# Patient Record
Sex: Male | Born: 1950 | Race: White | Hispanic: No
Health system: Western US, Academic
[De-identification: ages and names within clinical notes are randomized; demographics above are authoritative.]

---

## 2003-06-05 ENCOUNTER — Ambulatory Visit (EMERGENCY_DEPARTMENT_HOSPITAL): Payer: Self-pay

## 2003-06-06 ENCOUNTER — Encounter (HOSPITAL_COMMUNITY): Payer: Self-pay | Admitting: Surgery of the Hand

## 2003-06-22 ENCOUNTER — Encounter (HOSPITAL_BASED_OUTPATIENT_CLINIC_OR_DEPARTMENT_OTHER): Payer: Self-pay | Admitting: Rehabilitative and Restorative Service Providers"

## 2003-06-22 ENCOUNTER — Encounter (HOSPITAL_BASED_OUTPATIENT_CLINIC_OR_DEPARTMENT_OTHER): Payer: Self-pay

## 2003-07-27 ENCOUNTER — Encounter (HOSPITAL_BASED_OUTPATIENT_CLINIC_OR_DEPARTMENT_OTHER): Payer: Self-pay | Admitting: Rehabilitative and Restorative Service Providers"

## 2003-07-27 ENCOUNTER — Encounter (HOSPITAL_BASED_OUTPATIENT_CLINIC_OR_DEPARTMENT_OTHER): Payer: Self-pay

## 2003-08-24 ENCOUNTER — Encounter (HOSPITAL_BASED_OUTPATIENT_CLINIC_OR_DEPARTMENT_OTHER): Payer: Self-pay

## 2003-09-21 ENCOUNTER — Encounter (HOSPITAL_BASED_OUTPATIENT_CLINIC_OR_DEPARTMENT_OTHER): Payer: Medicaid Other

## 2011-04-05 ENCOUNTER — Emergency Department (HOSPITAL_BASED_OUTPATIENT_CLINIC_OR_DEPARTMENT_OTHER)
Admission: EM | Admit: 2011-04-05 | Discharge: 2011-04-05 | Disposition: A | Discharge status: Home / Self Care | Payer: Self-pay | Attending: Emergency Medicine | Admitting: Emergency Medicine

## 2011-04-05 ENCOUNTER — Other Ambulatory Visit (EMERGENCY_DEPARTMENT_HOSPITAL): Payer: Self-pay | Admitting: Emergency Medicine

## 2011-04-05 DIAGNOSIS — R1031 Right lower quadrant pain: Secondary | ICD-10-CM | POA: Insufficient documentation

## 2011-04-05 DIAGNOSIS — R109 Unspecified abdominal pain: Secondary | ICD-10-CM | POA: Insufficient documentation

## 2011-04-05 LAB — CBC, DIFF
% Basophils: 0 % (ref 0–1)
% Eosinophils: 3 % (ref 0–7)
% Immature Granulocytes: 0 % (ref 0–1)
% Lymphocytes: 51 % (ref 19–53)
% Monocytes: 8 % (ref 5–13)
% Neutrophils: 38 % (ref 34–71)
Absolute Eosinophil Count: 0.24 10*3/uL (ref 0.00–0.50)
Absolute Lymphocyte Count: 3.66 10*3/uL (ref 1.00–4.80)
Basophils: 0.03 10*3/uL (ref 0.00–0.20)
Hematocrit: 38 % (ref 38–50)
Hemoglobin: 13.1 g/dL (ref 13.0–18.0)
Immature Granulocytes: 0.01 10*3/uL (ref 0.00–0.05)
MCH: 31 pg (ref 27.3–33.6)
MCHC: 34.4 g/dL (ref 32.2–36.5)
MCV: 90 fL (ref 81–98)
Monocytes: 0.56 10*3/uL (ref 0.00–0.80)
Neutrophils: 2.75 10*3/uL (ref 1.80–7.00)
Platelet Count: 270 10*3/uL (ref 150–400)
RBC: 4.22 mil/uL — ABNORMAL LOW (ref 4.40–5.60)
RDW-CV: 13.8 % (ref 11.6–14.4)
WBC: 7.25 10*3/uL (ref 4.30–10.00)

## 2011-04-05 LAB — HEPATIC FUNCTION PANEL
ALT (GPT): 28 U/L (ref 10–48)
AST (GOT): 22 U/L (ref 15–40)
Albumin: 3.7 g/dL (ref 3.5–5.2)
Alkaline Phosphatase (Total): 71 U/L (ref 37–159)
Bilirubin (Direct): 0.1 mg/dL (ref 0.0–0.3)
Bilirubin (Total): 0.7 mg/dL (ref 0.2–1.3)
Protein (Total): 7.3 g/dL (ref 6.0–8.2)

## 2011-04-05 LAB — 1ST EXTRA LIME GREEN TOP

## 2011-04-05 LAB — PROTHROMBIN & PTT
Partial Thromboplastin Time: 29 s (ref 22–35)
Prothrombin INR: 1 (ref 0.8–1.3)
Prothrombin Time Patient: 12.5 s (ref 10.7–15.6)

## 2011-04-05 LAB — BASIC METABOLIC PANEL
Anion Gap: 8 (ref 3–11)
Calcium: 9.5 mg/dL (ref 8.9–10.2)
Carbon Dioxide, Total: 27 mEq/L (ref 22–32)
Chloride: 103 mEq/L (ref 98–108)
Creatinine: 0.94 mg/dL (ref 0.51–1.18)
GFR, Calc, African American: >60 mL/min (ref >59)
GFR, Calc, European American: >60 mL/min (ref >59)
Glucose: 104 mg/dL (ref 62–125)
Potassium: 4.4 mEq/L (ref 3.7–5.2)
Sodium: 138 mEq/L (ref 136–145)
Urea Nitrogen: 18 mg/dL (ref 8–21)

## 2013-02-19 ENCOUNTER — Other Ambulatory Visit (HOSPITAL_COMMUNITY): Payer: Self-pay | Admitting: Cardiovascular Disease

## 2013-02-19 ENCOUNTER — Other Ambulatory Visit (HOSPITAL_COMMUNITY): Payer: Self-pay

## 2013-02-19 ENCOUNTER — Inpatient Hospital Stay (HOSPITAL_COMMUNITY): Payer: Self-pay | Admitting: Cardiovascular Disease

## 2013-02-19 ENCOUNTER — Other Ambulatory Visit (EMERGENCY_DEPARTMENT_HOSPITAL): Payer: Self-pay | Admitting: Physician Assistant

## 2013-02-19 ENCOUNTER — Ambulatory Visit (HOSPITAL_BASED_OUTPATIENT_CLINIC_OR_DEPARTMENT_OTHER): Payer: Self-pay

## 2013-02-19 ENCOUNTER — Inpatient Hospital Stay (HOSPITAL_BASED_OUTPATIENT_CLINIC_OR_DEPARTMENT_OTHER)
Admission: EM | Admit: 2013-02-19 | Discharge: 2013-02-21 | DRG: 853 | Disposition: A | Discharge status: Home / Self Care | Payer: Self-pay | Attending: Cardiovascular Disease | Admitting: Cardiovascular Disease

## 2013-02-19 DIAGNOSIS — I214 Non-ST elevation (NSTEMI) myocardial infarction: Principal | ICD-10-CM | POA: Diagnosis present

## 2013-02-19 DIAGNOSIS — F17200 Nicotine dependence, unspecified, uncomplicated: Secondary | ICD-10-CM | POA: Diagnosis present

## 2013-02-19 DIAGNOSIS — I2510 Atherosclerotic heart disease of native coronary artery without angina pectoris: Secondary | ICD-10-CM | POA: Diagnosis present

## 2013-02-19 DIAGNOSIS — I9589 Other hypotension: Secondary | ICD-10-CM | POA: Diagnosis not present

## 2013-02-19 LAB — PROTHROMBIN & PTT
Partial Thromboplastin Time: 27 s (ref 22–35)
Prothrombin INR: 0.9 (ref 0.8–1.3)
Prothrombin Time Patient: 12.5 s (ref 10.7–15.6)

## 2013-02-19 LAB — CBC, DIFF
% Basophils: 0 %
% Basophils: 0 %
% Eosinophils: 2 %
% Eosinophils: 1 %
% Immature Granulocytes: 0 %
% Immature Granulocytes: 0 %
% Lymphocytes: 31 %
% Lymphocytes: 33 %
% Monocytes: 6 %
% Monocytes: 7 %
% Neutrophils: 61 %
% Neutrophils: 59 %
Absolute Eosinophil Count: 0.31 10*3/uL (ref 0.00–0.50)
Absolute Eosinophil Count: 0.12 10*3/uL (ref 0.00–0.50)
Absolute Lymphocyte Count: 4.35 10*3/uL (ref 1.00–4.80)
Absolute Lymphocyte Count: 2.87 10*3/uL (ref 1.00–4.80)
Basophils: 0.03 10*3/uL (ref 0.00–0.20)
Basophils: 0.02 10*3/uL (ref 0.00–0.20)
Hematocrit: 42 % (ref 38–50)
Hematocrit: 38 % (ref 38–50)
Hemoglobin: 14.4 g/dL (ref 13.0–18.0)
Hemoglobin: 13 g/dL (ref 13.0–18.0)
Immature Granulocytes: 0.05 10*3/uL (ref 0.00–0.05)
Immature Granulocytes: 0.01 10*3/uL (ref 0.00–0.05)
MCH: 32.2 pg (ref 27.3–33.6)
MCH: 31.6 pg (ref 27.3–33.6)
MCHC: 34.7 g/dL (ref 32.2–36.5)
MCHC: 34.5 g/dL (ref 32.2–36.5)
MCV: 93 fL (ref 81–98)
MCV: 92 fL (ref 81–98)
Monocytes: 0.79 10*3/uL (ref 0.00–0.80)
Monocytes: 0.58 10*3/uL (ref 0.00–0.80)
Neutrophils: 8.44 10*3/uL — ABNORMAL HIGH (ref 1.80–7.00)
Neutrophils: 5.19 10*3/uL (ref 1.80–7.00)
Platelet Count: 274 10*3/uL (ref 150–400)
Platelet Count: 239 10*3/uL (ref 150–400)
RBC: 4.47 mil/uL (ref 4.40–5.60)
RBC: 4.11 mil/uL — ABNORMAL LOW (ref 4.40–5.60)
RDW-CV: 14.2 % (ref 11.6–14.4)
RDW-CV: 14.1 % (ref 11.6–14.4)
WBC: 13.97 10*3/uL — ABNORMAL HIGH (ref 4.30–10.00)
WBC: 8.79 10*3/uL (ref 4.30–10.00)

## 2013-02-19 LAB — CHEST PAIN REFLEXIVE TESTING
Troponin_I Interpretation: ELEVATED
Troponin_I: 0.09 ng/mL — ABNORMAL HIGH (ref <0.04)
Troponin_I: 30.21 ng/mL — ABNORMAL HIGH (ref <0.04)

## 2013-02-19 LAB — CK MB MASS & QUOTIENT
CK MB Mass: 143 ng/mL — ABNORMAL HIGH (ref 0–5)
CK MB Quotient: 11 — ABNORMAL HIGH (ref 0–3)

## 2013-02-19 LAB — BASIC METABOLIC PANEL
Anion Gap: 6 (ref 3–11)
Calcium: 8.8 mg/dL — ABNORMAL LOW (ref 8.9–10.2)
Carbon Dioxide, Total: 21 mEq/L — ABNORMAL LOW (ref 22–32)
Chloride: 107 mEq/L (ref 98–108)
Creatinine: 0.8 mg/dL (ref 0.51–1.18)
GFR, Calc, African American: >60 mL/min (ref >59)
GFR, Calc, European American: >60 mL/min (ref >59)
Glucose: 97 mg/dL (ref 62–125)
Potassium: 4.3 mEq/L (ref 3.7–5.2)
Sodium: 134 mEq/L — ABNORMAL LOW (ref 136–145)
Urea Nitrogen: 18 mg/dL (ref 8–21)

## 2013-02-19 LAB — COMPREHENSIVE METABOLIC PANEL
ALT (GPT): 24 U/L (ref 10–48)
AST (GOT): 32 U/L (ref 15–40)
Albumin: 4.2 g/dL (ref 3.5–5.2)
Alkaline Phosphatase (Total): 84 U/L (ref 37–159)
Anion Gap: 5 (ref 3–11)
Bilirubin (Total): 0.4 mg/dL (ref 0.2–1.3)
Calcium: 9.3 mg/dL (ref 8.9–10.2)
Carbon Dioxide, Total: 26 mEq/L (ref 22–32)
Chloride: 103 mEq/L (ref 98–108)
Creatinine: 1.01 mg/dL (ref 0.51–1.18)
GFR, Calc, African American: >60 mL/min (ref >59)
GFR, Calc, European American: >60 mL/min (ref >59)
Glucose: 177 mg/dL — ABNORMAL HIGH (ref 62–125)
Potassium: 4.3 mEq/L (ref 3.7–5.2)
Protein (Total): 7 g/dL (ref 6.0–8.2)
Sodium: 134 mEq/L — ABNORMAL LOW (ref 136–145)
Urea Nitrogen: 21 mg/dL (ref 8–21)

## 2013-02-19 LAB — LAB ADD ON ORDER

## 2013-02-19 LAB — CK, CREATINE KINASE, TOTAL ACTIVITY: Creatine Kinase Total Activity: 1249 U/L — ABNORMAL HIGH (ref 30–285)

## 2013-02-19 LAB — L LACTATE, VENOUS WB (TO U/H LAB WITHIN 30 MIN): L Lactate (Direct), Venous Whole Blood: 1.8 mmol/L (ref 0.6–2.5)

## 2013-02-19 LAB — MAGNESIUM: Magnesium: 2 mg/dL (ref 1.8–2.4)

## 2013-02-20 ENCOUNTER — Other Ambulatory Visit (HOSPITAL_COMMUNITY): Payer: Self-pay | Admitting: Cardiovascular Disease

## 2013-02-20 LAB — BASIC METABOLIC PANEL
Anion Gap: 4 (ref 3–11)
Calcium: 8.3 mg/dL — ABNORMAL LOW (ref 8.9–10.2)
Carbon Dioxide, Total: 20 mEq/L — ABNORMAL LOW (ref 22–32)
Chloride: 110 mEq/L — ABNORMAL HIGH (ref 98–108)
Creatinine: 0.8 mg/dL (ref 0.51–1.18)
GFR, Calc, African American: >60 mL/min (ref >59)
GFR, Calc, European American: >60 mL/min (ref >59)
Glucose: 103 mg/dL (ref 62–125)
Potassium: 3.7 mEq/L (ref 3.7–5.2)
Sodium: 134 mEq/L — ABNORMAL LOW (ref 136–145)
Urea Nitrogen: 17 mg/dL (ref 8–21)

## 2013-02-20 LAB — CK MB MASS & QUOTIENT
CK MB Mass: 87 ng/mL — ABNORMAL HIGH (ref 0–5)
CK MB Quotient: 10 — ABNORMAL HIGH (ref 0–3)

## 2013-02-20 LAB — CK, CREATINE KINASE, TOTAL ACTIVITY: Creatine Kinase Total Activity: 899 U/L — ABNORMAL HIGH (ref 30–285)

## 2013-02-20 LAB — CHEST PAIN REFLEXIVE TESTING: Troponin_I: 18.52 ng/mL — ABNORMAL HIGH (ref <0.04)

## 2013-02-20 LAB — LIPID PANEL
Cholesterol (LDL): 115 mg/dL (ref <130)
Cholesterol/HDL Ratio: 5.4
HDL Cholesterol: 31 mg/dL — ABNORMAL LOW (ref >40)
Non-HDL Cholesterol: 136 mg/dL (ref 0–159)
Total Cholesterol: 167 mg/dL (ref <200)
Triglyceride: 105 mg/dL (ref <150)

## 2013-02-20 LAB — HEMOGLOBIN A1C, HPLC: Hemoglobin A1C: 5.5 % (ref 4.0–6.0)

## 2013-02-21 LAB — R/O MRSA

## 2013-02-21 LAB — R/O ACINETOBACTER CULTURE

## 2013-02-21 LAB — VANCO-RESIST ENTEROCOCCUS C/S

## 2013-02-25 ENCOUNTER — Ambulatory Visit: Payer: Self-pay

## 2013-02-25 NOTE — Telephone Encounter (Signed)
Pts daughter calling, states pt is having pain in the site of his cardiac catheter, denies swelling but states the pain is getting worse and travelling along his whole leg. Pt has follow up appt on Friday October 3rd, 14, and also has cardiology follow up on October 6, 14. Pt reports he is able to bear weight, has no other complaints other than the pain at the angio site, and describes swelling there the size of a bean. Advised pt to use ice take tyelnol and if those measures are not effective to go on in to ED, as he has no PCP, and is waiting to be seen at after care clinic and cardiology clinic later this week.

## 2013-02-25 NOTE — Telephone Encounter (Signed)
Protocol: POST-OP SYMPTOMS AND QUESTIONS-ADULT-AH  Affirmative: Other post-op symptom or question (all triage questions negative)  Disposition of Home Care suggested.

## 2013-02-28 ENCOUNTER — Ambulatory Visit (HOSPITAL_BASED_OUTPATIENT_CLINIC_OR_DEPARTMENT_OTHER): Payer: Medicaid Other | Attending: Internal Medicine | Admitting: Internal Medicine

## 2013-02-28 ENCOUNTER — Encounter (HOSPITAL_BASED_OUTPATIENT_CLINIC_OR_DEPARTMENT_OTHER): Payer: Self-pay

## 2013-02-28 VITALS — BP 112/79 | HR 80 | Temp 97.9°F | Resp 80 | Ht 66.0 in | Wt 160.0 lb

## 2013-02-28 DIAGNOSIS — R209 Unspecified disturbances of skin sensation: Secondary | ICD-10-CM | POA: Insufficient documentation

## 2013-02-28 DIAGNOSIS — I2510 Atherosclerotic heart disease of native coronary artery without angina pectoris: Secondary | ICD-10-CM | POA: Insufficient documentation

## 2013-02-28 MED ORDER — NITROGLYCERIN 0.3 MG SL SUBL
0.3000 mg | SUBLINGUAL_TABLET | SUBLINGUAL | Status: DC | PRN
Start: 2013-02-28 — End: 2013-02-28

## 2013-02-28 MED ORDER — NITROGLYCERIN 0.4 MG SL SUBL
0.4000 mg | SUBLINGUAL_TABLET | SUBLINGUAL | Status: DC | PRN
Start: 2013-02-28 — End: 2014-01-04

## 2013-02-28 NOTE — Patient Instructions (Addendum)
Take aspirin and clopidogrel (Plavix) EVERY DAY. This is critically important.     Okay to stop atorvastatin and lisinopril for now.     Continue to avoid ANY smoking.     Try a nitroglycerin tablet if you have any chest pain.     Don't go back to work until you see the heart doctor on Monday.     Please bring all of your medications to your next appointment.    If you have questions or concerns about your health, call [phone].

## 2013-02-28 NOTE — Progress Notes (Signed)
AFTER CARE CLINIC NOTE    ID/CHIEF COMPLAINT: The patient is a 62 year old year-old man here for inpatient follow-up of STEMI. The interview and exam were completed with the assistance of an in-person [place] interpreter. The patient's daughter was also present.     PROBLEM LIST:  Patient Active Problem List    Diagnosis Date Noted   . Coronary artery disease 02/28/2013     -- STEMI with DES to RCA (02/21/2013)   -- Echo with LVEF 50%, no LV thrombus      . Right leg paresthesias 02/28/2013        CURRENT MEDICATIONS:  as of end of visit on 02/28/2013  Current Outpatient Prescriptions   Medication Sig   . Aspirin 81 MG Oral Tab EC 81 mg by Mouth Daily   . Clopidogrel Bisulfate 75 MG Oral Tab 75 mg by Mouth Daily   . Nitroglycerin 0.4 MG Sublingual SL Tab Place 1 tablet (0.4 mg) under the tongue every 5 minutes as needed for chest pain. Repeat as needed up to 3 doses. If no relief, call 911.     No current facility-administered medications for this visit.       ALLERGIES:  Review of patient's allergies indicates:  No Known Allergies    INTERVAL HISTORY:  The patient was recently admitted to the cardiology service from 02/21/13 to 02/23/13 for STEMI. He presented with severe substernal chest pain and was found to have an inferior STEMI. In the cardiac cath lab his RCA was completely occluded and a drug-eluting stent was placed at that time. He was started on clopidogrel, aspirin, ACE-inhibitor and statin, but beta-blocker was held due to bradycardia and borderline hypotension (SBPs in the 90s).     Today the patient reports stopping all medications since Monday, except he continued taking aspirin. This was because he was having difficulty breathing and headache, symptoms that he attributed to medication side effects. These symptoms resolved after discontinuing the medications.     He does continue to have episodes of chest pressure, similar in character but much less severe than when he had the heart attack. These  episodes last about 5-10 minutes and occur mostly with exertion. No dyspnea, orthopnea or leg swelling. Not currently experiencing any chest pain.     He also reports tingling, burning pain in his right leg, which has been a problem since his appendectomy 2-3 years ago. This is worse in certain positions and has been worse since Sunday. No leg weakness and no change in his chronic low back pain.     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 Denies alcohol consumption or illicit drug use. He emigrated from [place] in 1999. He works as a Geophysical data processor.     REVIEW OF SYSTEMS:  Negative except as per HPI.     PHYSICAL EXAM:  VITALS - BP 112/79  Pulse 80  Temp(Src) 97.9 F (36.6 C) (Temporal)  Resp 80  Ht 5\' 6"  (1.676 m)  Wt 160 lb (72.576 kg)  BMI 25.84 kg/m2  GENERAL - no acute distress   HEENT - anicteric, moist mucous membranes   RESPIRATORY - breathing comfortably, speaking in full sentences, lungs clear throughout, no crackles or rhonchi   CARDIOVASCULAR - regular, no murmurs, strong radial pulses, no leg edema, JVP  5 cm H2O   ABDOMEN - normoactive bowel sounds, soft, non-tender, non-distended, no organomegaly   GU - right inguinal area with small nodule in femoral area, no femoral bruits or thrills, nearly resolved ecchymoses   NEURO - alert and appropriate, speech fluent, moving all extremities, gait smooth and narrow   MENTAL STATUS - pleasant and cooperative, normal affect     STUDIES:  No new results.     Cardiac catheterization report reviewed and notable for complete RCA obstruction with subsequent drug-eluting stent placement. No other significant coronary lesions identified.     Echocardiogram with expected wall motion abnormalities and slightly low ejection fraction, but no significant valvular  pathology.     ASSESSMENT/PLAN:    Coronary artery disease s/p recent STEMI: Overall the patient is improved, but I am concerned about his residual angina, particularly as he self-discontinued the clopidogrel. Discussed with Dr. Corrie Dandy, who agrees that actual in-stent thrombosis is unlikely given his overall clinical improvement. I spent a considerable amount of time reinforcing the critical importance of taking aspirin and clopidogrel daily. Given his concerns about medication side effects, I advised that he could stop the atorvastatin and lisinopril for the time being, so long as he continues aspirin and clopidogrel daily without fail. He should ultimately be taking a statin (perhaps at reduced dose) as well as ACE-inhibitor and beta-blocker. Will defer re-introduction of these medications to his cardiologist (appointment with Dr. Olevia Perches on Monday 10/6) and PCP. In the interim I provided a supply of sublingual nitroglycerin for him to take with any anginal symptoms.     Chronic leg pain & paresthesias: Not discussed in depth today, but there may have been some nerve irritation from catheterization in the femoral artery. No evidence of pseudoaneurysm on exam, but could consider vascular duplex if symptoms worsen or persist.       FOLLOW-UP & PRIMARY CARE REFERRAL:  The patient is already an established patient of ARNP Lynden Ang at the [facility] in [place], [place]. He will return there for primary care follow-up.

## 2013-03-03 ENCOUNTER — Telehealth (HOSPITAL_BASED_OUTPATIENT_CLINIC_OR_DEPARTMENT_OTHER): Payer: Self-pay | Admitting: Cardiovascular Disease

## 2013-03-03 ENCOUNTER — Encounter (HOSPITAL_BASED_OUTPATIENT_CLINIC_OR_DEPARTMENT_OTHER): Payer: Self-pay | Admitting: Cardiovascular Disease

## 2013-03-03 ENCOUNTER — Ambulatory Visit (HOSPITAL_BASED_OUTPATIENT_CLINIC_OR_DEPARTMENT_OTHER): Payer: Medicaid Other | Attending: Cardiovascular Disease | Admitting: Cardiovascular Disease

## 2013-03-03 VITALS — BP 109/77 | HR 63 | Temp 96.8°F | Resp 18 | Ht 66.0 in | Wt 162.7 lb

## 2013-03-03 DIAGNOSIS — I2510 Atherosclerotic heart disease of native coronary artery without angina pectoris: Secondary | ICD-10-CM | POA: Insufficient documentation

## 2013-03-03 DIAGNOSIS — G5790 Unspecified mononeuropathy of unspecified lower limb: Secondary | ICD-10-CM | POA: Insufficient documentation

## 2013-03-03 DIAGNOSIS — I219 Acute myocardial infarction, unspecified: Secondary | ICD-10-CM | POA: Insufficient documentation

## 2013-03-03 DIAGNOSIS — I209 Angina pectoris, unspecified: Secondary | ICD-10-CM | POA: Insufficient documentation

## 2013-03-03 MED ORDER — METOPROLOL TARTRATE 25 MG OR TABS
12.5000 mg | ORAL_TABLET | Freq: Every day | ORAL | Status: AC
Start: 2013-03-03 — End: ?

## 2013-03-03 MED ORDER — CAPSAICIN 0.025 % EX CREA
1.0000 | TOPICAL_CREAM | Freq: Three times a day (TID) | CUTANEOUS | Status: AC
Start: 2013-03-03 — End: ?

## 2013-03-03 NOTE — Telephone Encounter (Signed)
Pt needs appt w/ Olevia Perches in 3 mos.

## 2013-03-03 NOTE — Patient Instructions (Signed)
1. Continue the aspirin and plavix    2. Cut the ATORVASTATIN in half, take 40mg  daily. If you continue to have muscle pains with this, call the clinic and we will write you a different medication.    3. Schedule an Echo (Heart Ultrasound) for approximately 10/25.    4. Start the new medication called Metoprolol (beta blocker).    5. You can stop the lisinopril.    6. Follow-up with Dr. Olevia Perches in 3 months.

## 2013-03-03 NOTE — Progress Notes (Addendum)
Cardiology Clinic Note    Chief Complaint:  [name] is a 62 year old year old [place] speaking male with no known past medical history here for hospital follow-up after STEMI.    History of Present Illness:   Patient is seen with his Daughter who provides interpretation for today's visit. Leonard Price is a 62yo gentleman with previously no medical history who was admitted to the [facility] cardiology service from 9/24-9/26 after he presented with 10/10 substernal chest pain. He was found to have ST elevations in leads II, III, and aVF and code STEMI was called and he was sent to the cath lab where 100% RCA occlusion was discovered and a drug eluting stent was placed. There was no significant left sided coronary disease. He was stared on aspirin, clopidogrel, and atorvastatin 80mg  daily. Beta blocker was not started due to asymptomatic bradycardia to the 50s while he was admitted.    Last week, the patient presented to aftercare clinic for follow-up where it was discovered that he had stopped his medications due to concerns of side effects. He began to have diffuse muscle pains and dyspnea which he attributed to his new medications. He did not know which was the cause so he stopped them all. After discussion with Dr. Graciela Husbands in aftercare clinic, he resumed his aspirin and plavix and has been taking these since without fail. He has not yet resumed his atorvastatin as he was instructed to discuss this with [place] today.    He states that he now has stable, exertional chest pressure which improves with rest. He also reports some mild dyspnea on exertion. This is new since his hospitalization. He denies that this is getting any worse. He denies any palpitations, orthopnea, PND or edema. He has taken his SL NTG on one occasion which resulted in improvement of his chest pain. His only other complaint is a burning pain on his right medial thigh that is new since his catheterization. He denies any bruising, swelling, or rash in the  area.      Patient Active Problem List   Diagnosis   . Coronary artery disease   . Right leg paresthesias       Outpatient Prescriptions Prior to Visit   Medication Sig Dispense Refill   . Aspirin 81 MG Oral Tab EC 81 mg by Mouth Daily       . Clopidogrel Bisulfate 75 MG Oral Tab 75 mg by Mouth Daily       . Nitroglycerin 0.4 MG Sublingual SL Tab Place 1 tablet (0.4 mg) under the tongue every 5 minutes as needed for chest pain. Repeat as needed up to 3 doses. If no relief, call 911.  25 tablet  0     No facility-administered medications prior to visit.       ROS:  Pt denies SOB, CP, palpitations, dizziness at the present encounter. Otherwise, ROS is as per HPI, all other systems are negative.    Physical Examination:   BP 109/77  Pulse 63  Temp(Src) 96.8 F (36 C) (Temporal)  Resp 18  Ht 5\' 6"  (1.676 m)  Wt 162 lb 11.2 oz (73.8 kg)  BMI 26.27 kg/m2  SpO2 100%  Gen: Thin, well appearing and in NAD   HEENT: sclera anicteric   Neck: supple, no JVD, no bruit   Pulm: ctab; no wheezing/rhonchi   CV: rrr; no m/r/g   Abdomen: soft; nt/nd; bs+   Skin: no rash   Extr: no edema, right femoral cath site  without bruising, swelling, pulsations, or bruits.   Neuro: alert and oriented x3    Studies:  Cardiac Cath 02/19/13:  Dominance: [x]  Right [_] Left [_] Codominant  Left main: Medium caliber and angiographically normal.   Left anterior descending: Medium sized vessel that is angiographically normal. It gives off a medium sized first diagonal branch which is angiographically normal.   Circumflex: Medium sized vessel proximally but small in size distally as it enters the AV groove. The first obtuse marginal is a large vessel that has luminal irregularities and feeds a large portion of the inferolateral wall.   Right coronary artery: Occluded in the proximal vessel, just past the ostium.    Left Heart Hemodynamics:   Left ventricular pressure: 120/10 mmHg  Aortic pressure: 120/77/106 mmHg    IMPRESSIONS:  [x]  1 Vessel  coronary artery disease.  [x]  Aortic stenosis is not present.   [x]  Left ventricular end diastolic pressures is [x]  normal [_] abnormal  [x]  Systemic aortic pressure is [x]  normal [_] abnormal       Transthoracic Echo 02/20/13:  Conclusions:  1. The rhythm is sinus  2. LV size is normal. LV systolic function is low-normal.  a. LV EF is 49.8% by 3D biplane analysis  b. Wall motion abnormalities are present, as described in the above diagram  c. Diastolic function is normal with normal left atrial filling pressure  c. No LV thrombus is seen  3. Trace aortic regurgitation. Trace mitral regurgitation. Trace tricuspid regurgitation. Mild pulmonary regurgitation.  4. Right ventricular size and function are normal.   5. No pericardial effusion is present  6. The aortic root diameter at the sinuses of valsalva is at the upper limit of normal  7. No prior echo for comparison     EKG:  02/19/13 14:12:  EKG showed ST elevations in leads II, III, aVF w/ reciprocal changes in I, aVL, V2, V3 concerning for possible inferior MI. Sinus rhythm, rate 69.    02/19/13 14:25: Right sided EKG showed ST elevations in rV4 (3/4 box), rV5, rV6. Sinus rhythm, rate 71.    Assessment/Plan:  [name] is a 62yo man here for follow-up of RCA STEMI. He is doing well status post PCI with occasional, mild exertional angina.    # CAD s/p PCI with DES to RCA on 02/20/13  Doing will with some mild anginal symptoms. 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 Will also start BB today. May need long acting nitrate in the future if he continues to have angina with this regimen.  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 Will restart at lower dose as above. Consider switching to alternative high intensity statin if he has muscle pains with this.    # RLE neuropathy  Likely secondary to his catheterization. No aneurysm present. Resassured him that this will improve. Can try capsaicin cream for symptomatic relief.    Patient seen and discussed with Dr. Silvestre Gunner, Attending Physician.    Roderic Palau MD  R2, Internal Medicine  p: [phone]    -------------------------------------------  Attending: Cloyd Stagers, MD  I saw and  evaluated the patient and agree with Dr. Deanne Coffer note.

## 2013-03-04 NOTE — Addendum Note (Signed)
Addended by: Silvestre Gunner DAVID on: 03/04/2013 08:55 AM     Modules accepted: Level of Service

## 2013-03-21 ENCOUNTER — Ambulatory Visit (HOSPITAL_BASED_OUTPATIENT_CLINIC_OR_DEPARTMENT_OTHER): Payer: Medicaid Other | Attending: Cardiovascular Disease

## 2013-03-21 DIAGNOSIS — I252 Old myocardial infarction: Secondary | ICD-10-CM | POA: Insufficient documentation

## 2013-03-21 DIAGNOSIS — I7090 Unspecified atherosclerosis: Secondary | ICD-10-CM | POA: Insufficient documentation

## 2013-06-02 ENCOUNTER — Encounter (HOSPITAL_BASED_OUTPATIENT_CLINIC_OR_DEPARTMENT_OTHER): Payer: No Typology Code available for payment source | Admitting: Cardiovascular Disease

## 2013-07-26 ENCOUNTER — Telehealth (HOSPITAL_BASED_OUTPATIENT_CLINIC_OR_DEPARTMENT_OTHER): Payer: Self-pay | Admitting: Cardiovascular Disease

## 2013-07-26 NOTE — Telephone Encounter (Signed)
CONFIRMED PHONE NUMBER: [phone]  CALLERS FIRST AND LAST NAME: Cornell BarmanVladimir Vasilyevich Rimmer  FACILITY NAME: na TITLE: na  CALLERS RELATIONSHIP:Self  RETURN CALL: No call back needed     SUBJECT: Cancellation/Reschedule   REASON FOR CANCELLATION: Other seen a different provider  RESCHEDULED: NO

## 2013-07-28 ENCOUNTER — Encounter (HOSPITAL_BASED_OUTPATIENT_CLINIC_OR_DEPARTMENT_OTHER): Payer: No Typology Code available for payment source | Admitting: Cardiovascular Disease

## 2013-07-29 NOTE — Telephone Encounter (Signed)
Patient is on recall for Dr. Olevia PerchesGoldberger.

## 2013-08-21 ENCOUNTER — Telehealth (HOSPITAL_BASED_OUTPATIENT_CLINIC_OR_DEPARTMENT_OTHER): Payer: Self-pay | Admitting: Cardiovascular Disease

## 2013-08-21 NOTE — Telephone Encounter (Signed)
Per Dr. Olevia PerchesGoldberger, Cardiology:  Rescheduled patient to 09-01-2013 at 09:30.    2X Voice messages were left for patient at [phone] and [phone].

## 2013-08-25 ENCOUNTER — Encounter (HOSPITAL_BASED_OUTPATIENT_CLINIC_OR_DEPARTMENT_OTHER): Payer: No Typology Code available for payment source | Admitting: Cardiovascular Disease

## 2013-09-01 ENCOUNTER — Encounter (HOSPITAL_BASED_OUTPATIENT_CLINIC_OR_DEPARTMENT_OTHER): Payer: No Typology Code available for payment source | Admitting: Cardiovascular Disease

## 2013-12-21 ENCOUNTER — Inpatient Hospital Stay (HOSPITAL_COMMUNITY): Payer: Self-pay | Admitting: Cardiology

## 2013-12-21 ENCOUNTER — Inpatient Hospital Stay (HOSPITAL_BASED_OUTPATIENT_CLINIC_OR_DEPARTMENT_OTHER)
Admission: EM | Admit: 2013-12-21 | Discharge: 2013-12-22 | DRG: 143 | Disposition: A | Discharge status: Home / Self Care | Payer: Self-pay | Attending: Cardiology | Admitting: Cardiology

## 2013-12-21 DIAGNOSIS — I2510 Atherosclerotic heart disease of native coronary artery without angina pectoris: Secondary | ICD-10-CM | POA: Diagnosis present

## 2013-12-21 DIAGNOSIS — I252 Old myocardial infarction: Secondary | ICD-10-CM

## 2013-12-21 DIAGNOSIS — R079 Chest pain, unspecified: Secondary | ICD-10-CM

## 2013-12-21 DIAGNOSIS — E785 Hyperlipidemia, unspecified: Secondary | ICD-10-CM | POA: Diagnosis present

## 2013-12-21 DIAGNOSIS — Z9861 Coronary angioplasty status: Secondary | ICD-10-CM

## 2013-12-21 DIAGNOSIS — R0789 Other chest pain: Principal | ICD-10-CM | POA: Diagnosis present

## 2013-12-21 LAB — COMPREHENSIVE METABOLIC PANEL
ALT (GPT): 15 U/L (ref 10–48)
AST (GOT): 15 U/L (ref 9–38)
Albumin: 4.3 g/dL (ref 3.5–5.2)
Alkaline Phosphatase (Total): 72 U/L (ref 37–159)
Anion Gap: 4 (ref 4–12)
Bilirubin (Total): 0.5 mg/dL (ref 0.2–1.3)
Calcium: 9.5 mg/dL (ref 8.9–10.2)
Carbon Dioxide, Total: 29 mEq/L (ref 22–32)
Chloride: 102 mEq/L (ref 98–108)
Creatinine: 0.95 mg/dL (ref 0.51–1.18)
GFR, Calc, African American: >60 mL/min (ref >59)
GFR, Calc, European American: >60 mL/min (ref >59)
Glucose: 98 mg/dL (ref 62–125)
Potassium: 4.4 mEq/L (ref 3.6–5.2)
Protein (Total): 7.2 g/dL (ref 6.0–8.2)
Sodium: 135 mEq/L (ref 135–145)
Urea Nitrogen: 16 mg/dL (ref 8–21)

## 2013-12-21 LAB — PROTHROMBIN & PTT
Partial Thromboplastin Time: 29 s (ref 22–35)
Prothrombin INR: 1 (ref 0.8–1.3)
Prothrombin Time Patient: 12.7 s (ref 10.7–15.6)

## 2013-12-21 LAB — LIPASE: Lipase: 24 U/L (ref <70)

## 2013-12-21 LAB — CBC, DIFF
% Basophils: 0 %
% Eosinophils: 2 %
% Immature Granulocytes: 0 %
% Lymphocytes: 54 %
% Monocytes: 8 %
% Neutrophils: 36 %
Absolute Eosinophil Count: 0.14 10*3/uL (ref 0.00–0.50)
Absolute Lymphocyte Count: 3.52 10*3/uL (ref 1.00–4.80)
Basophils: 0.02 10*3/uL (ref 0.00–0.20)
Hematocrit: 44 % (ref 38–50)
Hemoglobin: 15 g/dL (ref 13.0–18.0)
Immature Granulocytes: 0.01 10*3/uL (ref 0.00–0.05)
MCH: 30.8 pg (ref 27.3–33.6)
MCHC: 33.8 g/dL (ref 32.2–36.5)
MCV: 91 fL (ref 81–98)
Monocytes: 0.52 10*3/uL (ref 0.00–0.80)
Neutrophils: 2.34 10*3/uL (ref 1.80–7.00)
Platelet Count: 265 10*3/uL (ref 150–400)
RBC: 4.87 mil/uL (ref 4.40–5.60)
RDW-CV: 14 % (ref 11.6–14.4)
WBC: 6.55 10*3/uL (ref 4.30–10.00)

## 2013-12-21 LAB — TROPONIN_I
Troponin_I Interpretation: NORMAL
Troponin_I: <0.03 ng/mL (ref <0.04)

## 2013-12-21 LAB — L LACTATE, VENOUS WB (TO U/H LAB WITHIN 30 MIN): L Lactate (Direct), Venous Whole Blood: 1.6 mmol/L (ref 0.6–2.5)

## 2013-12-22 ENCOUNTER — Encounter (HOSPITAL_BASED_OUTPATIENT_CLINIC_OR_DEPARTMENT_OTHER): Payer: Self-pay | Admitting: Cardiovascular Disease

## 2013-12-22 ENCOUNTER — Other Ambulatory Visit (HOSPITAL_BASED_OUTPATIENT_CLINIC_OR_DEPARTMENT_OTHER): Payer: Self-pay

## 2013-12-22 DIAGNOSIS — R079 Chest pain, unspecified: Secondary | ICD-10-CM

## 2013-12-22 DIAGNOSIS — I2510 Atherosclerotic heart disease of native coronary artery without angina pectoris: Secondary | ICD-10-CM

## 2013-12-22 DIAGNOSIS — R0789 Other chest pain: Secondary | ICD-10-CM

## 2013-12-22 LAB — BASIC METABOLIC PANEL
Anion Gap: 5 (ref 4–12)
Calcium: 8.8 mg/dL — ABNORMAL LOW (ref 8.9–10.2)
Carbon Dioxide, Total: 23 mEq/L (ref 22–32)
Chloride: 106 mEq/L (ref 98–108)
Creatinine: 0.83 mg/dL (ref 0.51–1.18)
GFR, Calc, African American: >60 mL/min (ref >59)
GFR, Calc, European American: >60 mL/min (ref >59)
Glucose: 131 mg/dL — ABNORMAL HIGH (ref 62–125)
Potassium: 3.9 mEq/L (ref 3.6–5.2)
Sodium: 134 mEq/L — ABNORMAL LOW (ref 135–145)
Urea Nitrogen: 16 mg/dL (ref 8–21)

## 2013-12-22 LAB — TROPONIN_I
Troponin_I Interpretation: NORMAL
Troponin_I Interpretation: NORMAL
Troponin_I: <0.03 ng/mL (ref <0.04)
Troponin_I: <0.03 ng/mL (ref <0.04)

## 2013-12-22 LAB — CBC (HEMOGRAM)
Hematocrit: 42 % (ref 38–50)
Hemoglobin: 14 g/dL (ref 13.0–18.0)
MCH: 30.6 pg (ref 27.3–33.6)
MCHC: 33.7 g/dL (ref 32.2–36.5)
MCV: 91 fL (ref 81–98)
Platelet Count: 233 10*3/uL (ref 150–400)
RBC: 4.58 mil/uL (ref 4.40–5.60)
RDW-CV: 14 % (ref 11.6–14.4)
WBC: 9.24 10*3/uL (ref 4.30–10.00)

## 2013-12-22 LAB — PARTIAL THROMBOPLASTIN TIME: Partial Thromboplastin Time: 49 s — ABNORMAL HIGH (ref 22–35)

## 2013-12-23 LAB — R/O ACINETOBACTER CULTURE

## 2013-12-23 LAB — R/O MRSA

## 2013-12-23 LAB — VANCO-RESIST ENTEROCOCCUS C/S

## 2014-01-04 ENCOUNTER — Other Ambulatory Visit (HOSPITAL_BASED_OUTPATIENT_CLINIC_OR_DEPARTMENT_OTHER): Payer: Self-pay | Admitting: Internal Medicine

## 2014-01-04 DIAGNOSIS — R079 Chest pain, unspecified: Secondary | ICD-10-CM

## 2014-01-05 MED ORDER — NITROSTAT 0.4 MG SL SUBL
SUBLINGUAL_TABLET | SUBLINGUAL | Status: AC
Start: 2014-01-05 — End: ?

## 2014-01-10 ENCOUNTER — Telehealth (HOSPITAL_BASED_OUTPATIENT_CLINIC_OR_DEPARTMENT_OTHER): Payer: Self-pay | Admitting: Cardiovascular Disease

## 2014-01-10 NOTE — Telephone Encounter (Signed)
CONFIRMED PHONE NUMBER: n/a  CALLERS FIRST AND LAST NAME: Leonard BarmanVladimir Vasilyevich Price  FACILITY NAME: n/a TITLE: n/a  CALLERS RELATIONSHIP:Self  RETURN CALL: No call back needed     SUBJECT: Cancellation/Reschedule   REASON FOR CANCELLATION: Other Can no make appointment  RESCHEDULED: NO

## 2014-01-12 ENCOUNTER — Encounter (HOSPITAL_BASED_OUTPATIENT_CLINIC_OR_DEPARTMENT_OTHER): Payer: Self-pay | Admitting: Cardiovascular Disease

## 2014-01-22 NOTE — Telephone Encounter (Signed)
Per Vita Barley, added pt to November recall list for Dr Olevia Perches

## 2022-02-10 IMAGING — CT CTA ABDOMEN WITH BILATERAL RUNOFF
2 of 3 series · 10 of 46 positions shown, 11 images · IV contrast (isovue)
Comparison: Abdominal ultrasound 08/10/2021, lower extremity Doppler ultrasound 
07/27/2021 showing normal ABIs but nonvisualized bilateral posterior tibial 
arteries and moderate diffuse plaquing

________________________________________________________________________________________________ 
CTA ABDOMEN WITH BILATERAL RUNOFF, 02/10/2022 [DATE]: 
CLINICAL INDICATION: Chronic bilateral foot and leg pain, history of coronary 
artery bypass, coronary artery stents and pacer 
A search for DICOM formatted images was conducted for prior CT imaging studies 
completed at a non-affiliated media free facility.
TECHNIQUE: The lung bases through the feet were scanned with 150 mL of Isovue 
370 intravenously on a high-resolution CT scanner using dose reduction 
techniques. Routine MPR and MIP 3D renderings were reconstructed on an 
independent workstation with concurrent physician supervision.

[Series 4: angiorunoff 1.5 b31s · axial · 0.97mm/px · z∈[-1253,-173]mm · 7 of 894 slices shown, 8 images]
[im 87/894  soft-tissue]
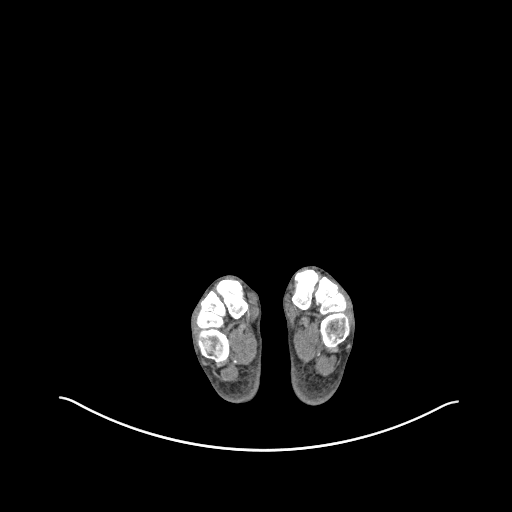
[im 87/894  bone]
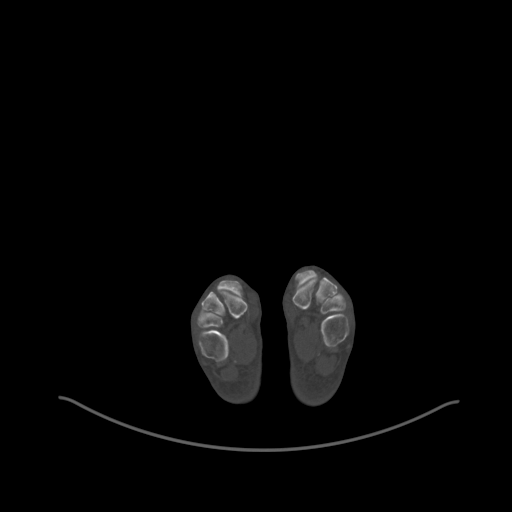
[im 202/894  soft-tissue]
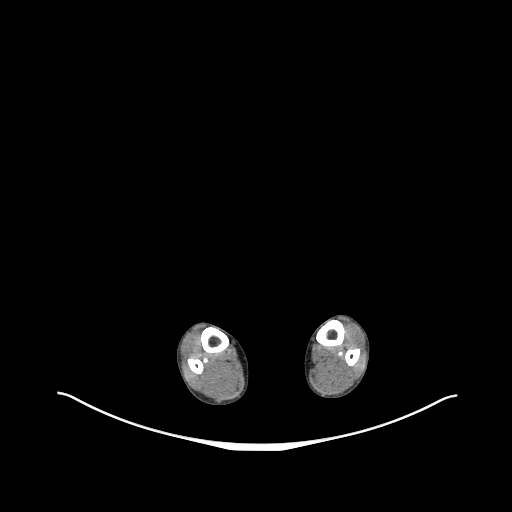
[im 317/894  soft-tissue]
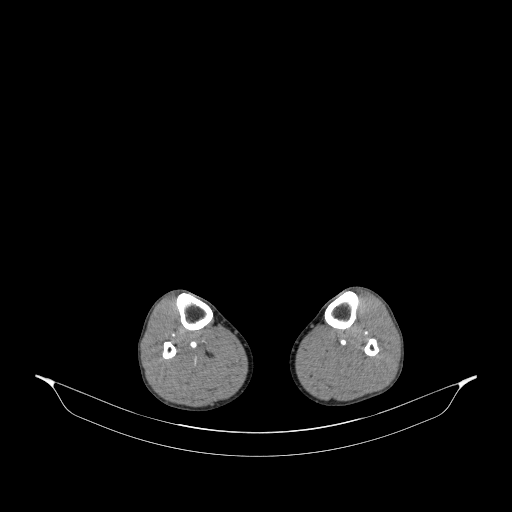
[im 461/894  soft-tissue]
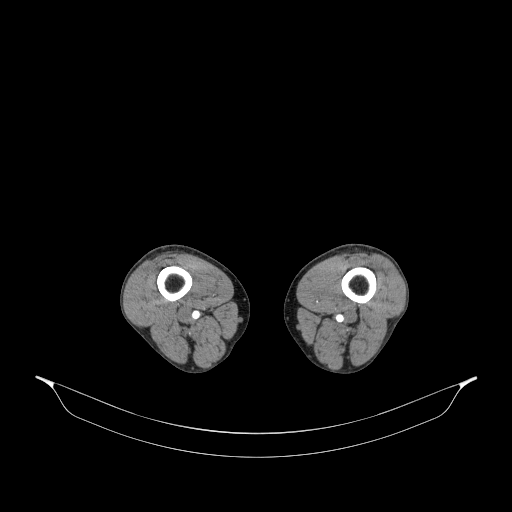
[im 577/894  soft-tissue]
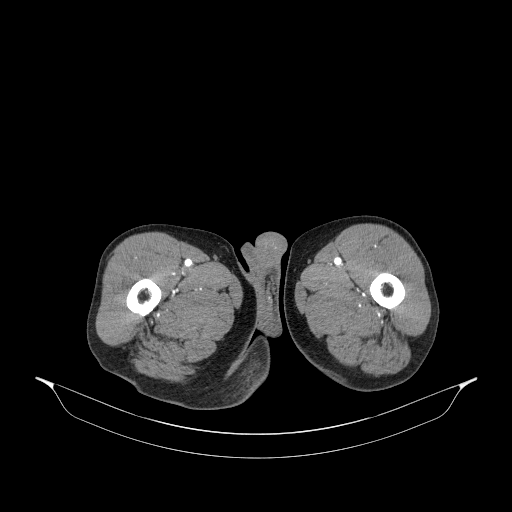
[im 692/894  soft-tissue]
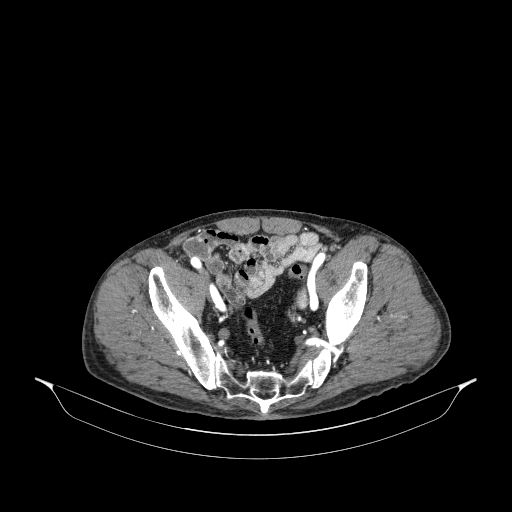
[im 807/894  soft-tissue]
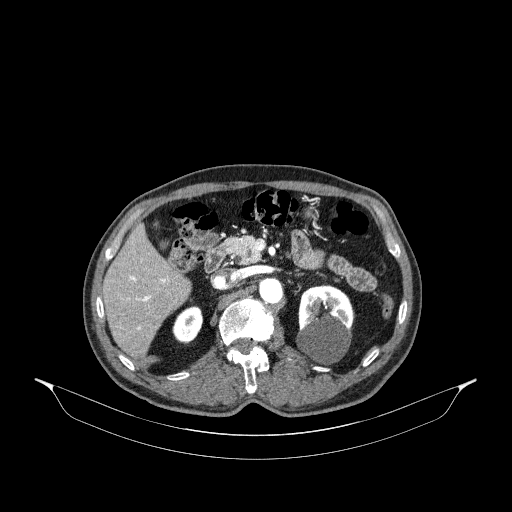

[Series 6: coronal · coronal · 0.90mm/px · 3 of 148 slices shown]
[im 50/148  soft-tissue]
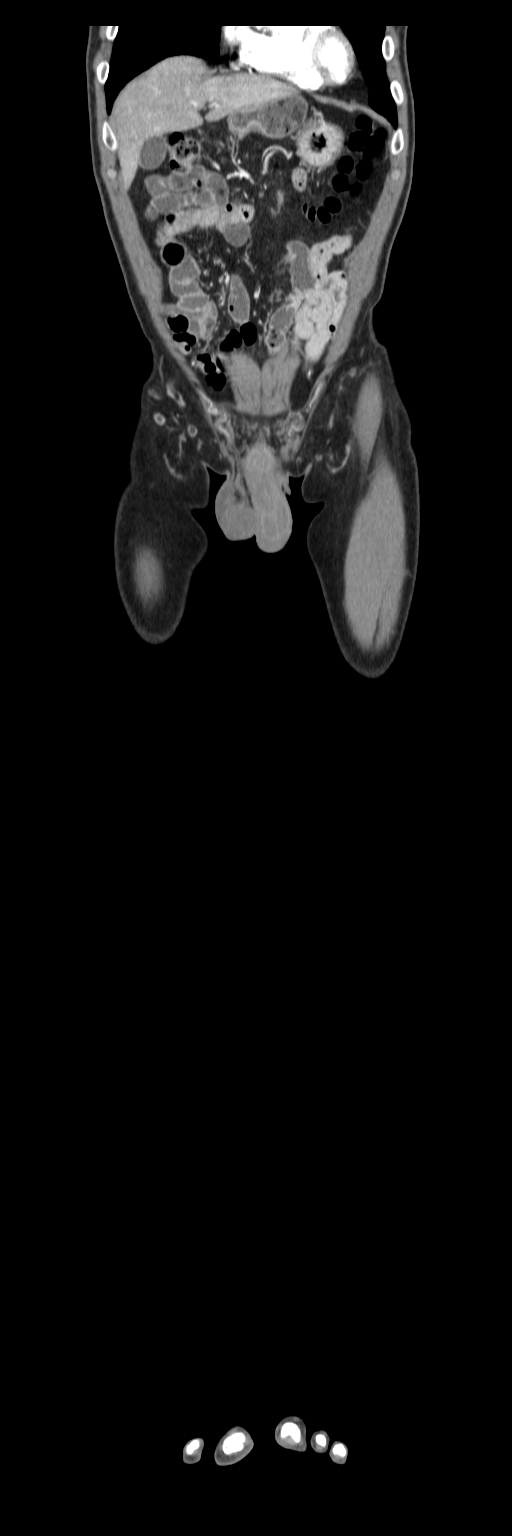
[im 66/148  soft-tissue]
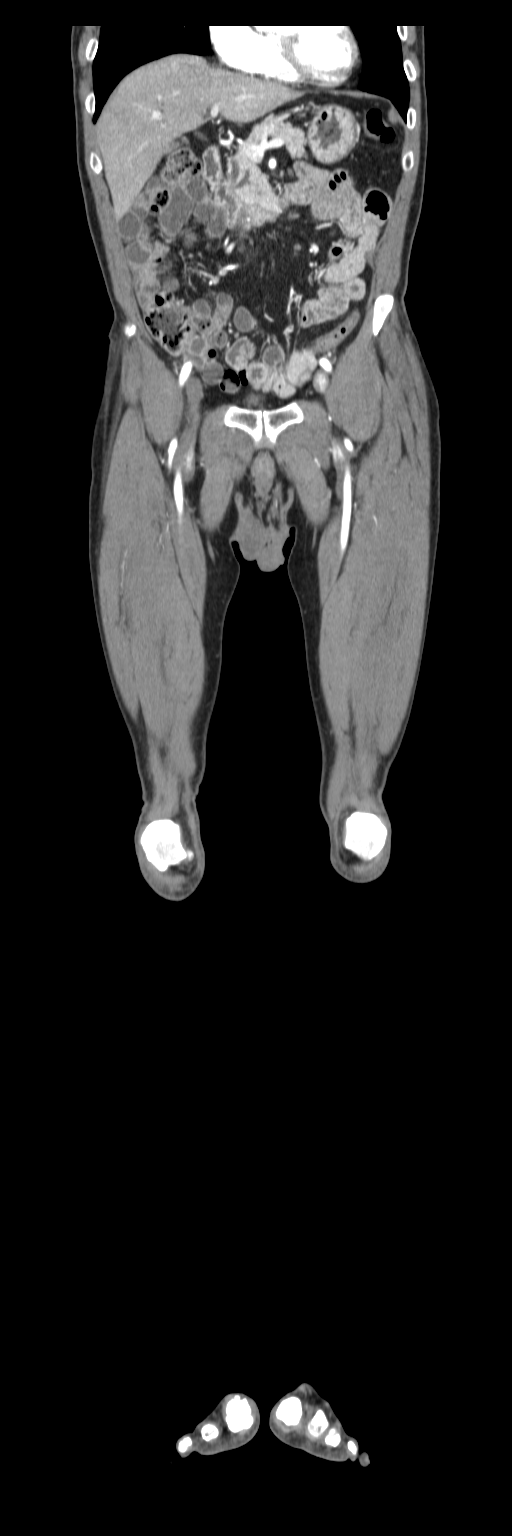
[im 82/148  soft-tissue]
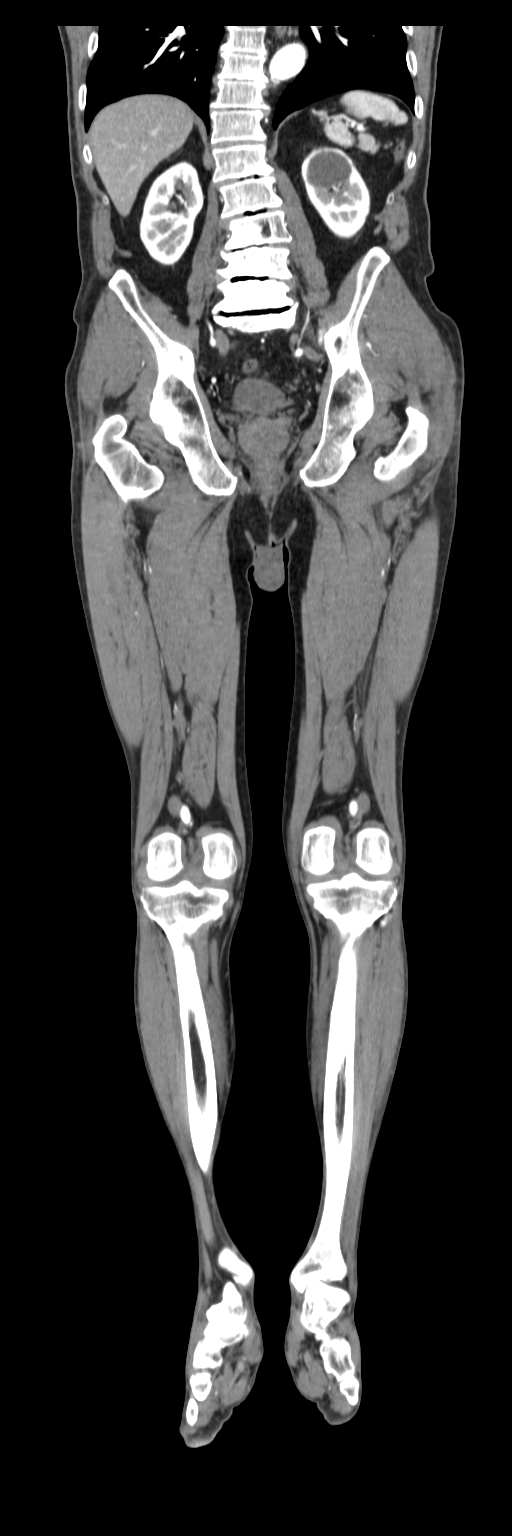

[10 of 46 positions shown; findings below may reference images not displayed]

FINDINGS: ABDOMINAL AORTA: Atherosclerotic plaquing. 3 cm infrarenal abdominal aortic 
aneurysm in the AP direction. Does not involve the aortic bifurcation. Moderate 
circumferential noncalcified plaquing. 
MESENTERIC ARTERIES: No significant mesenteric stenosis or aneurysm. 
RENAL ARTERIES: No significant renal artery stenosis or aneurysm. 0.6 cm each. 
ILIOFEMORAL VESSELS: Right common iliac artery 1.3 cm. Left common iliac artery 
1.2 cm. Mild atherosclerotic plaquing. 
Moderate soft plaque internal and external iliac arteries.  
LEFT common iliac artery posterior wall aneurysm distal measures 0.5 x 0.8 cm 
seen best on sagittal series 7 image 104 . 
RIGHT internal iliac artery mild fusiform aneurysmal dilatation measures 0.9 cm 
axial series 4 image 193. 
Mild plaquing bilateral common femoral arteries, profunda femoral arteries, and 
superficial femoral arteries throughout their length. 
RIGHT LOWER EXTREMITY:  Right anterior tibial artery originates in the expected 
location, small caliber, terminating in the mid leg. Tiny posterior tibial 
artery origin abruptly terminates into small muscular branches. Dominant 
peroneal artery supplies the foot and reconstitutes the dorsalis pedis just 
above the ankle. Mild fusiform aneurysm 0.6 cm distal RIGHT peroneal artery at 
the ankle. 
LEFT LOWER EXTREMITY: Slightly high origin LEFT anterior tibial artery, 
terminating in the mid leg. Tiny posterior tibial artery origin abruptly 
terminates into small muscular branches. Dominant peroneal artery supplies the 
foot. No reconstituted dorsalis pedis identified. 
LUNG BASES: Mild basal atelectasis. 
LIVER: No mass.  No intrahepatic biliary dilatation. 
GALLBLADDER: No wall thickening.  No stones. 
COMMON BILE DUCT: Normal caliber.  No stones. 
SPLEEN: Within normal limits. 
PANCREAS: No mass.  No pancreatic fluid collections. 
ADRENALS: No masses. 
KIDNEYS: 4.8 cm LEFT renal cortical cyst. Tiny RIGHT cortical renal cysts No 
masses.  No hydronephrosis. 0.8 cm ill-defined hyperdensity LEFT bladder base 
[DATE] position, image 227 series 4 without hydronephrosis appears to extend into 
the ureter at the UVJ, measuring over a segmental length of approximately
cm. 
LYMPH NODES: No adenopathy. 
STOMACH, SMALL BOWEL AND COLON: Unremarkable stomach, small bowel, colon. 
Duodenal diverticulum at the pancreatic head without inflammation. 
PERITONEAL CAVITY: No mesenteric stranding or free fluid. No free air. Small 
fat-containing periumbilical hernia. 
OSSEOUS STRUCTURES: Mild lumbar scoliosis. Sclerosis lower lumbar vertebral 
bodies with vertebral body hemangioma L3. Right tibial screw. 
PELVIC ORGANS: Enlarged prostate, measures 5.2 x 4.7 cm, elevates the bladder 
base. Slight asymmetric fullness LEFT seminal vesicle.
IMPRESSION: 0.8 cm ill-defined hyperdensity LEFT urinary bladder base extends into the UVJ 
1.3 cm without hydronephrosis, concerning for urothelial neoplasm versus blood 
clot. Recommend urologic evaluation with cystoscopy. 
3.0 cm infrarenal abdominal aortic aneurysm. Diffuse plaquing abdominal aorta 
and iliac vessels. 
0.5 x 0.8 cm distal LEFT common iliac artery posterior wall perfused aneurysm. 
Mild 0.9 cm fusiform aneurysmal dilatation RIGHT internal iliac artery. 
Normal external iliac arteries, common femoral arteries, superficial femoral 
arteries and popliteal arteries. 
Bilateral posterior tibial arteries occlude proximally . 
Bilateral anterior tibial arteries occlude in the mid leg. 
Single-vessel runoff peroneal artery to each foot. 0.6 cm mild fusiform 
aneurysmal dilatation distal RIGHT peroneal artery at  ankle up to 0.6 cm. 
Right dorsalis pedis is reconstituted at the ankle. Left dorsalis pedis is not 
reconstituted.  
Findings will be called to the referring clinicians office by the radiology 
assistant at opening 02/13/2022. 
RADIATION DOSE REDUCTION: All CT scans are performed using radiation dose 
reduction techniques, when applicable.  Technical factors are evaluated and 
adjusted to ensure appropriate moderation of exposure.  Automated dose 
management technology is applied to adjust the radiation doses to minimize 
exposure while achieving diagnostic quality images.

## 2022-06-07 IMAGING — DX CHEST PA AND LATERAL
1 series · 2 of 2 positions shown · non-contrast
Comparison: CTA runoff 02/10/2022.

________________________________________________________________________________________________ 
CLINICAL INDICATION: Cough, coronary artery stents

[Series 1: PA · 0.14mm/px · 2 of 2 slices shown]
[im 1/2]
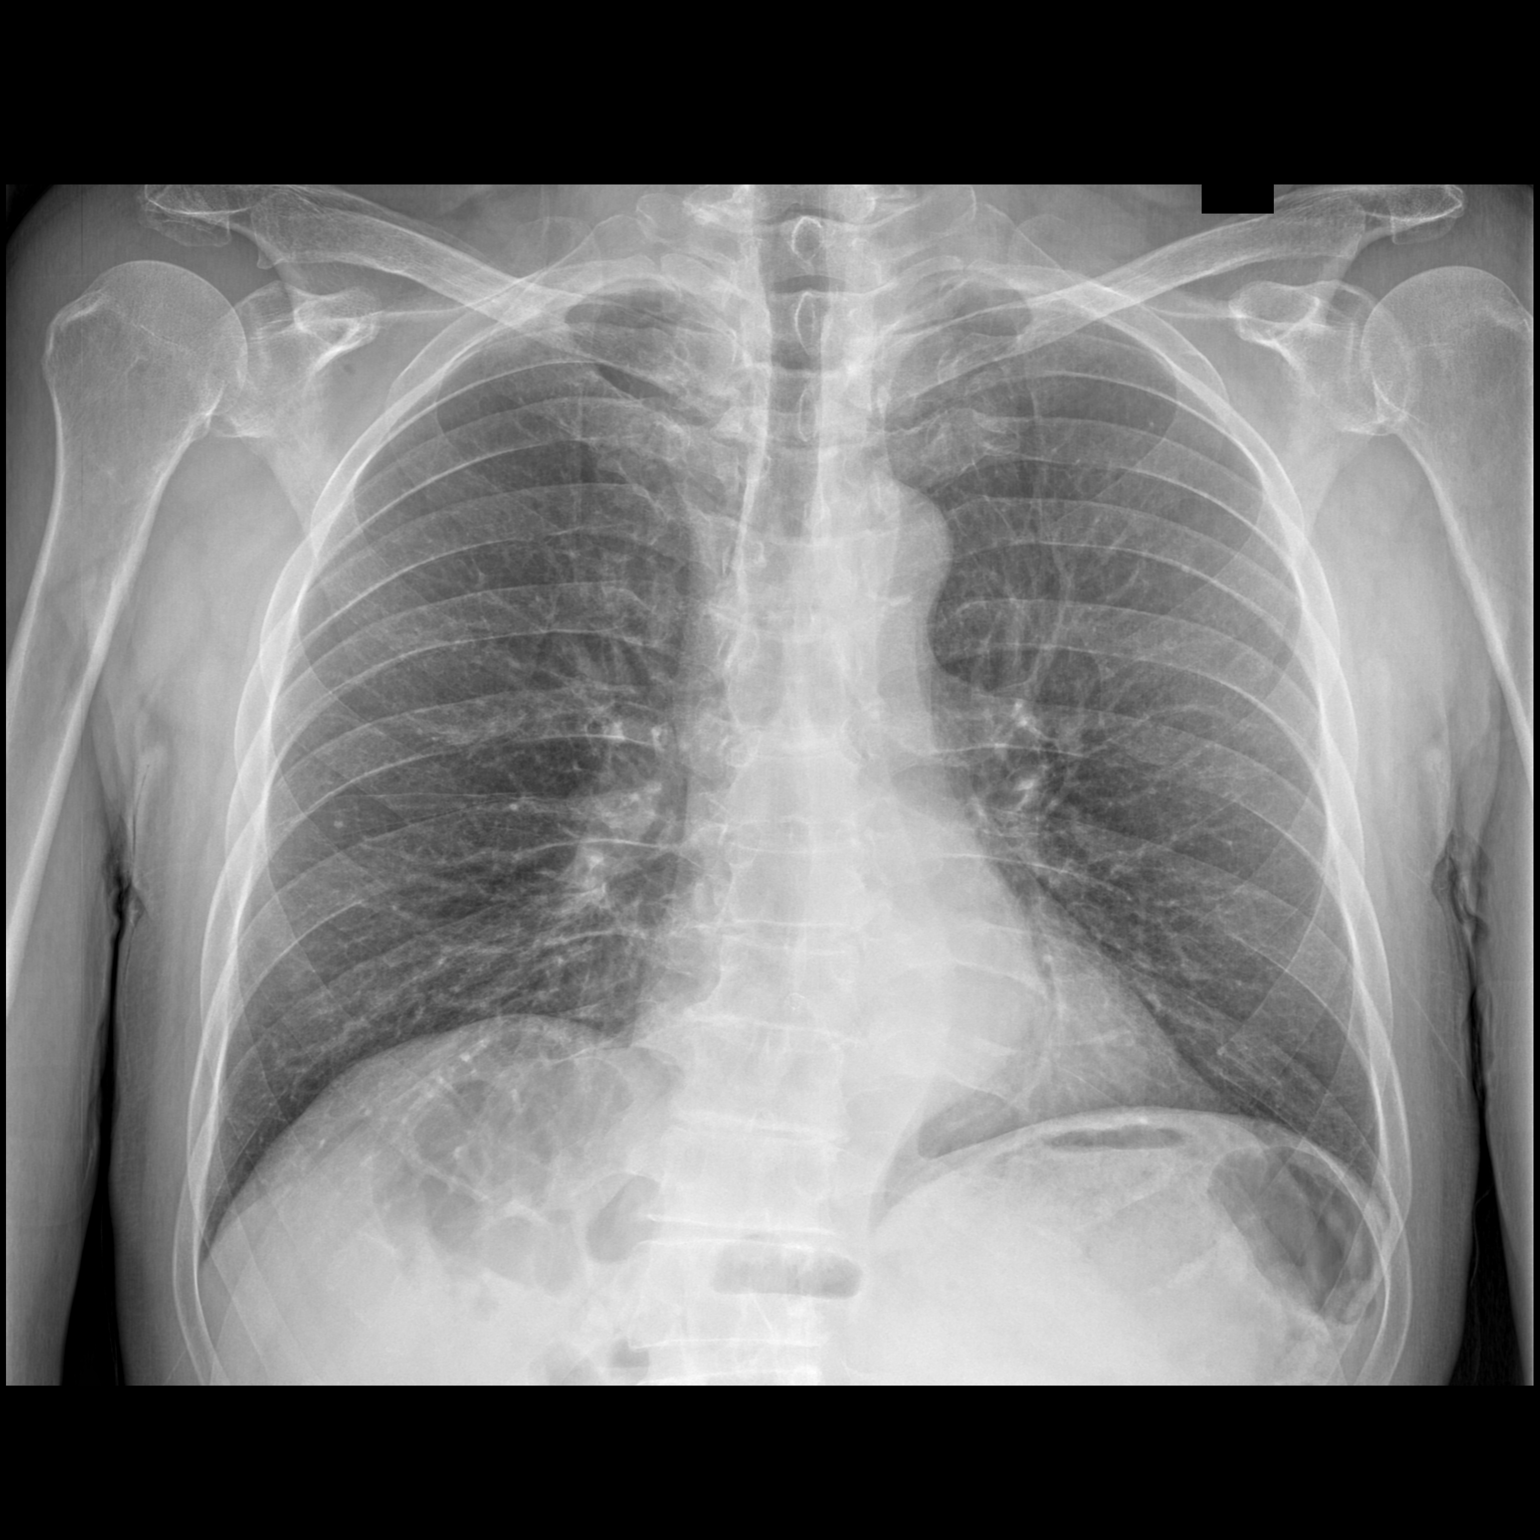
[im 2/2]
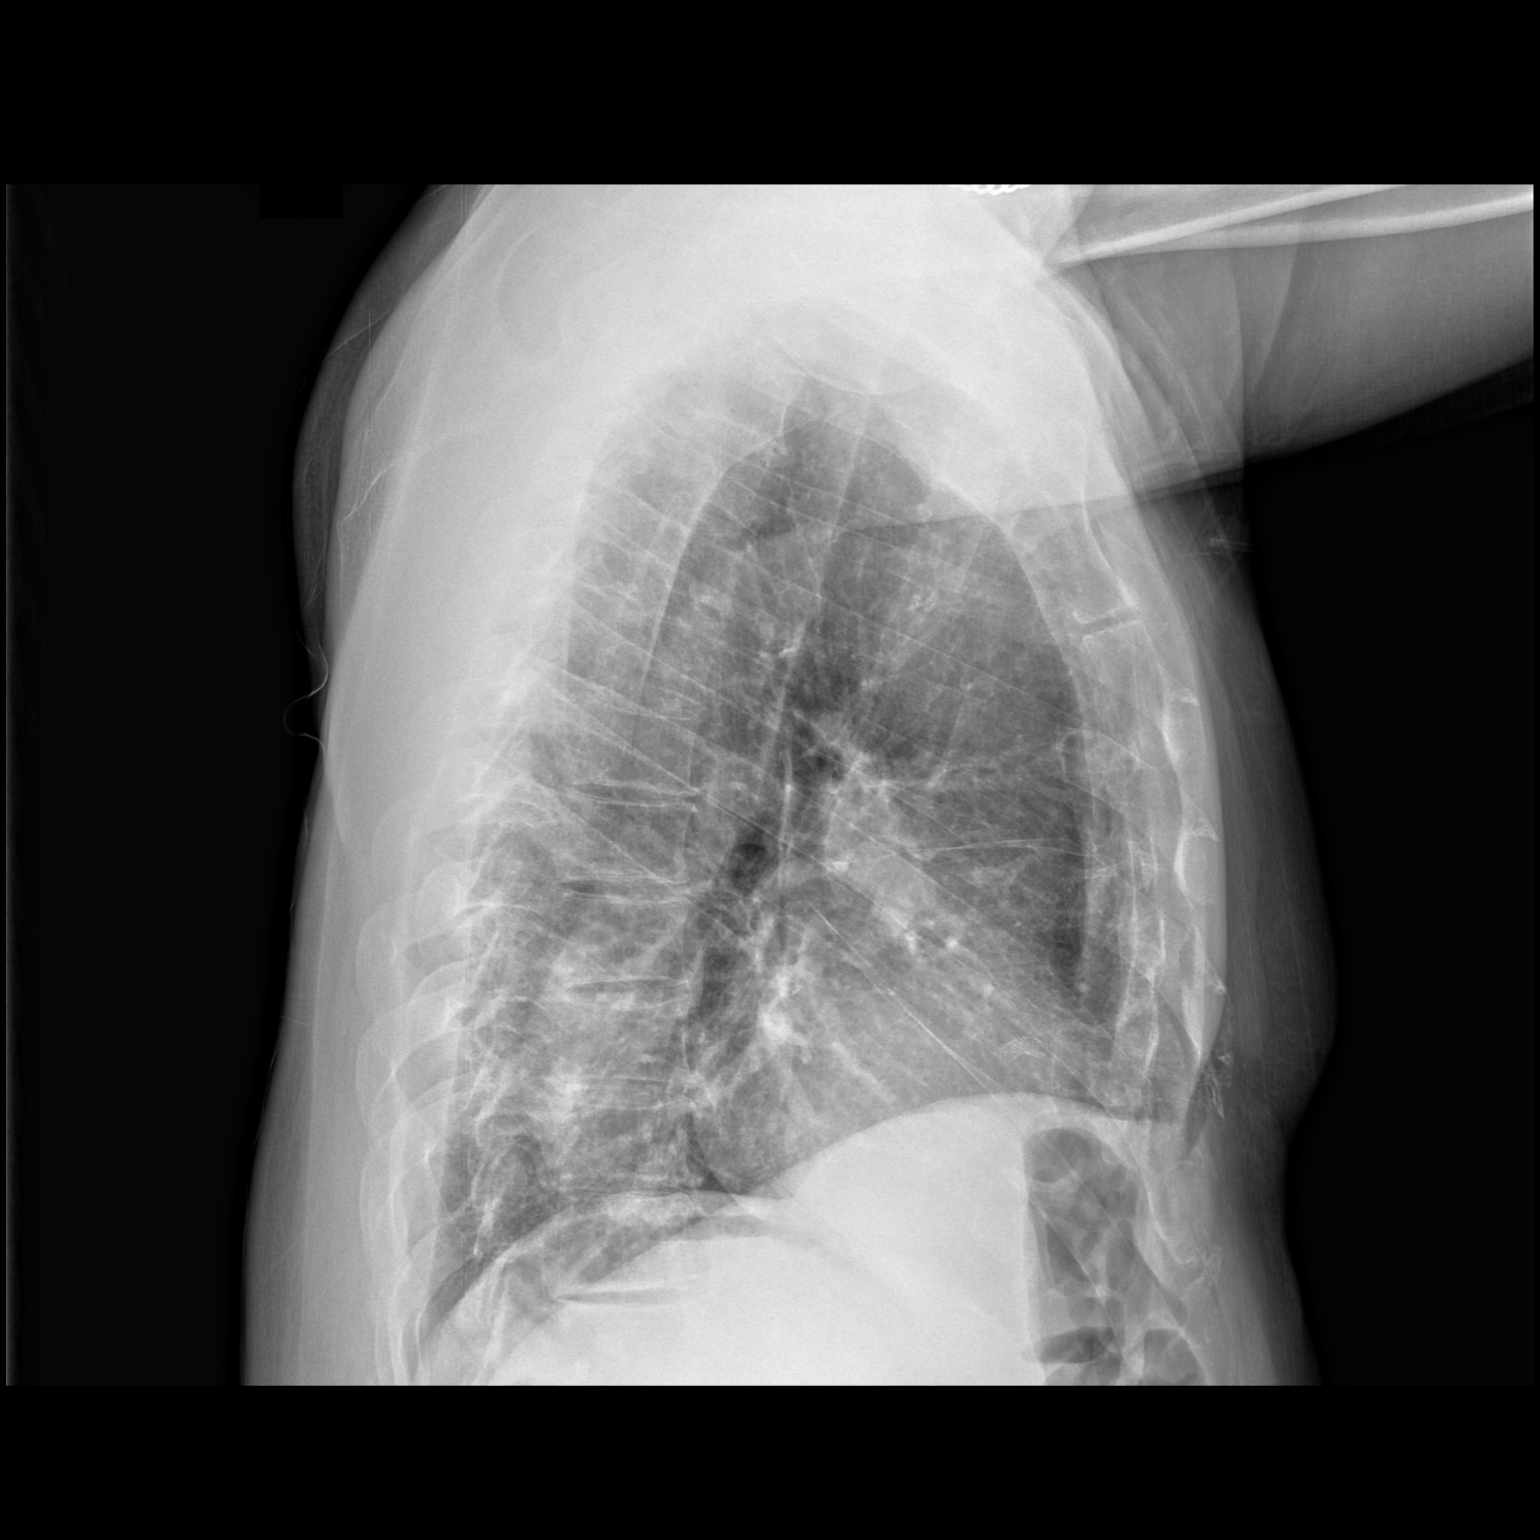

[2 of 2 positions shown; findings below may reference images not displayed]

FINDINGS: Normal heart size. Coronary artery stent. Tortuous descending aorta. 
Mild bronchial wall thickening. No infiltrate or pulmonary nodule. Sharp 
costophrenic angles. Global osteopenia. Mild dextroscoliosis thoracolumbar 
junction.
IMPRESSION: Mild bronchitis. No pneumonia.

## 2022-06-28 IMAGING — MR MRI PELVIS W/WO CONTRAST
11 of 12 series · 39 of 48 positions shown · IV contrast (gadolinium)
Comparison: CTA exam of 02/10/2022

________________________________________________________________________________________________ 
MRI PELVIS W/WO CONTRAST, MRI ABDOMEN W/WO CONTRAST, 06/28/2022 [DATE]: 
CLINICAL INDICATION: Follow-up to CTA exam. Lower abdominal pain and a small 
amount of hematuria x3 weeks. Other specified disorders of bladder.
TECHNIQUE: Multiplanar, multiecho position MR images of the abdomen and pelvis 
were performed without and with 7 mL of intravenous gadolinium were injected by 
hand. 0.5 mL of gadolinium were discarded.

[Series 101: survey supine · axial · 15.0mm · 1.76mm/px · 1 of 17 slices shown]
[im 1/17]
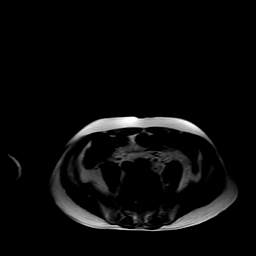

[Series 201: (id) cor bh · coronal · 4.0mm · 0.75mm/px · 1 of 36 slices shown]
[im 1/36]
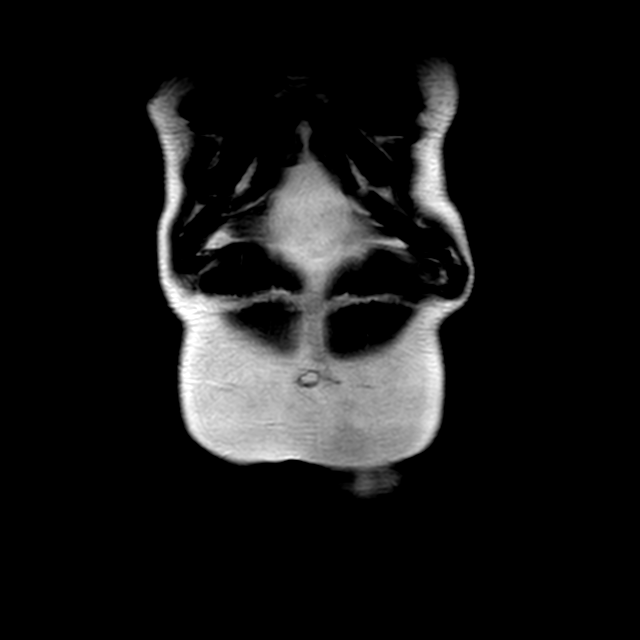

[Series 302: sout of phase · axial · 6.0mm · 1.09mm/px · z∈[-267,+218]mm · 5 of 72 slices shown (1 of 2)]
[im 1/72]
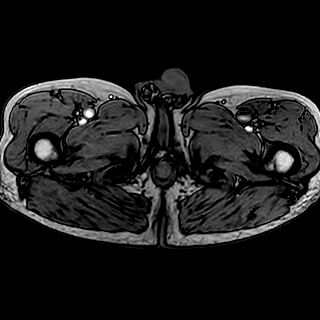
[im 18/72]
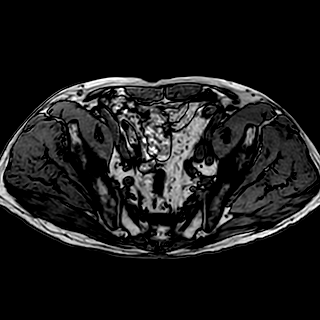
[im 36/72]
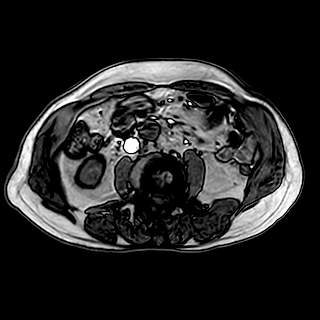
[im 54/72]
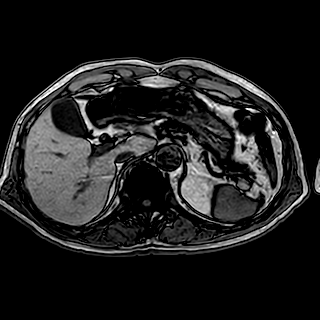
[im 72/72]
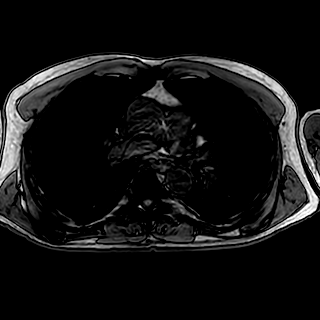

[Series 303: sin phase · axial · 6.0mm · 1.09mm/px · z∈[-267,+218]mm · 5 of 72 slices shown (1 of 2)]
[im 1/72]
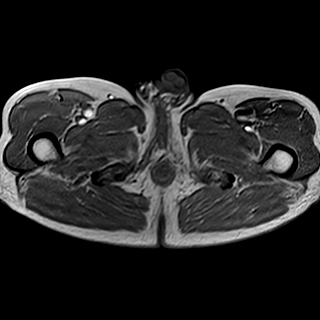
[im 18/72]
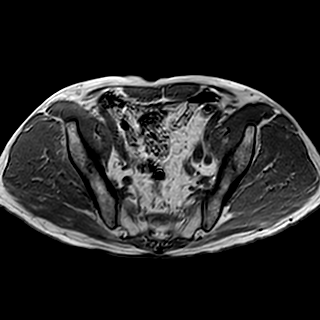
[im 36/72]
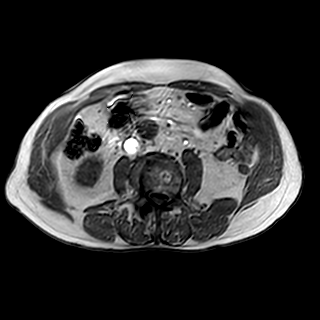
[im 54/72]
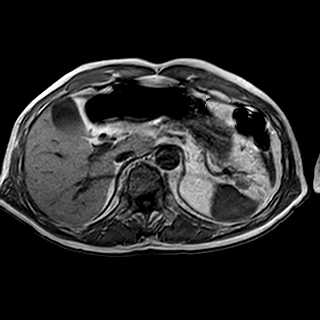
[im 72/72]
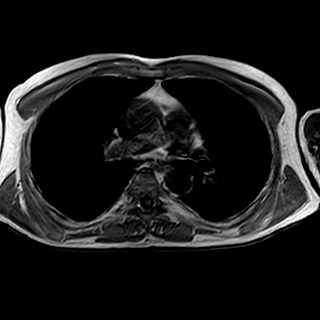

[Series 401: t2_ax_mvxd_hr_rt · axial · 5.0mm · 0.78mm/px · z∈[-261,+213]mm · 5 of 82 slices shown]
[im 1/82]
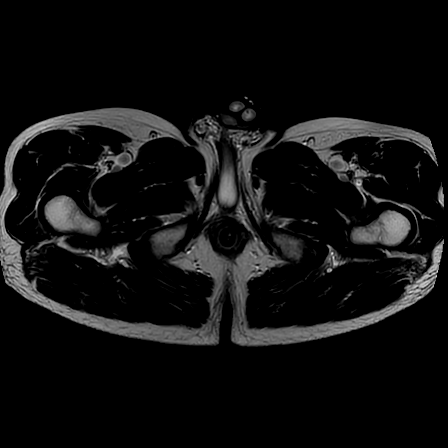
[im 21/82]
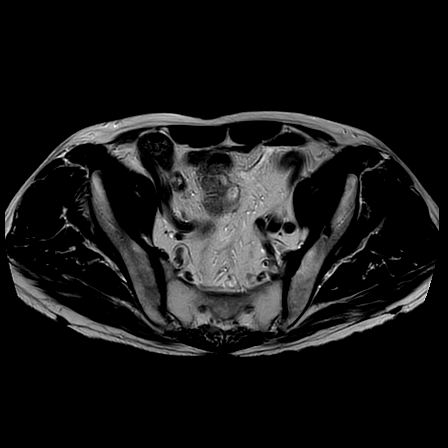
[im 41/82]
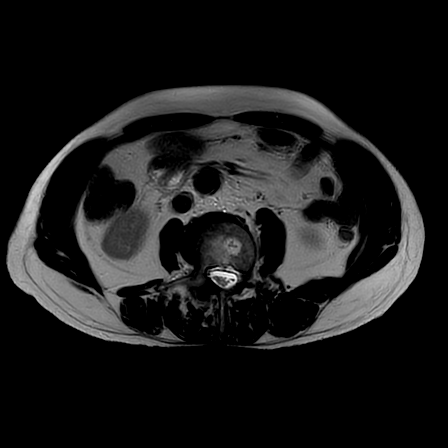
[im 61/82]
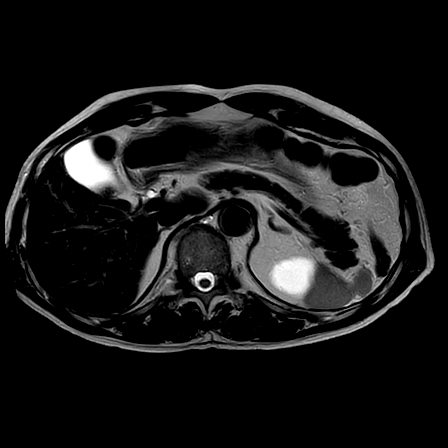
[im 82/82]
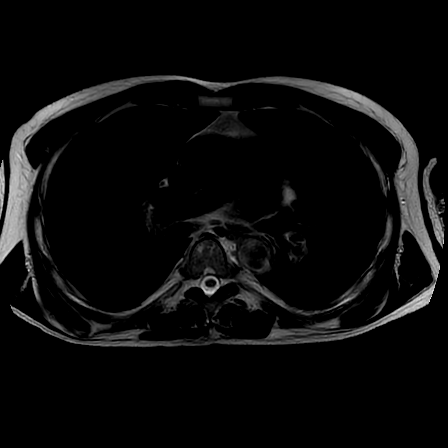

[Series 501: t2_spir_ax_mvxd_rt_fast · axial · 5.0mm · 0.88mm/px · z∈[-261,+201]mm · 5 of 80 slices shown]
[im 1/80]
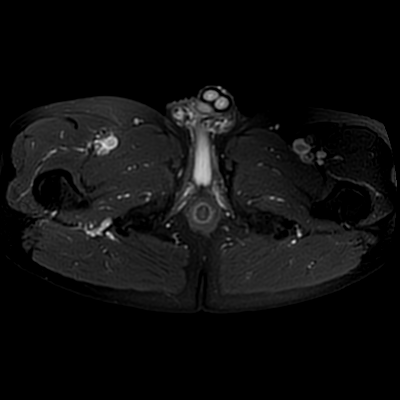
[im 20/80]
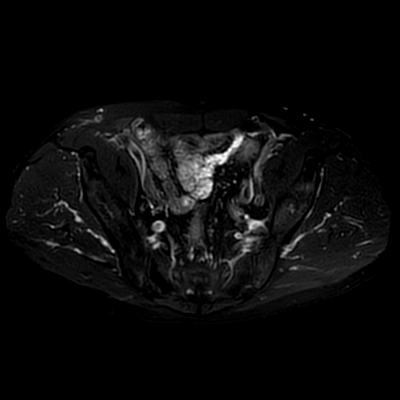
[im 40/80]
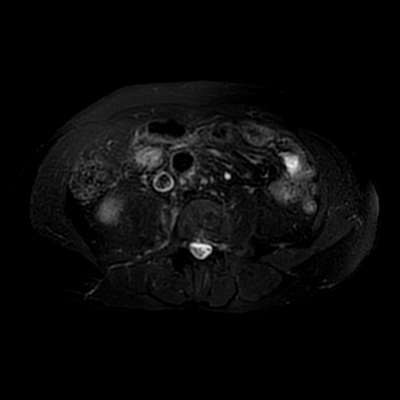
[im 60/80]
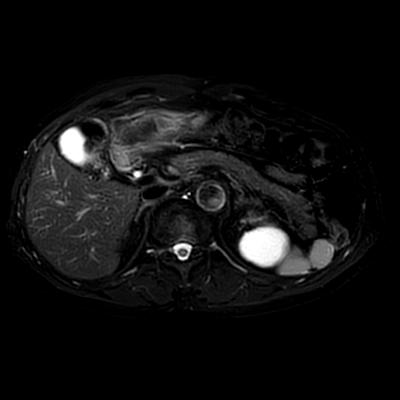
[im 80/80]
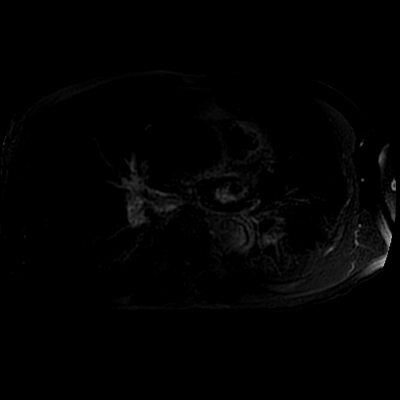

[Series 703: (id) · axial · 5.0mm · 1.52mm/px · z∈[-269,-33]mm · 3 of 44 slices shown]
[im 1/44]
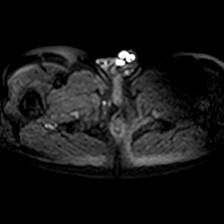
[im 22/44]
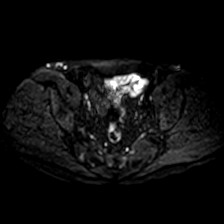
[im 44/44]
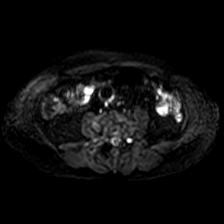

[Series 704: dadc 600 · axial · 5.0mm · 1.52mm/px · z∈[-269,-33]mm · 3 of 44 slices shown]
[im 1/44]
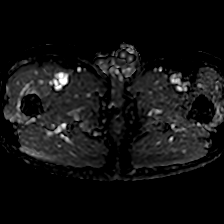
[im 22/44]
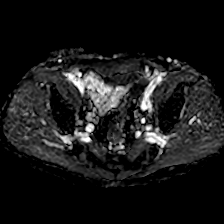
[im 44/44]
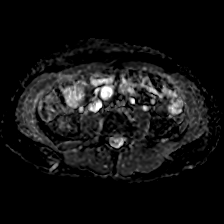

[Series 902: sout of phase · axial · 6.0mm · 1.09mm/px · z∈[-267,+218]mm · 5 of 72 slices shown (2 of 2)]
[im 1/72]
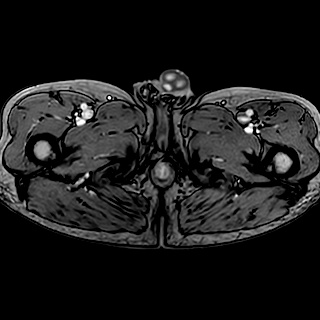
[im 18/72]
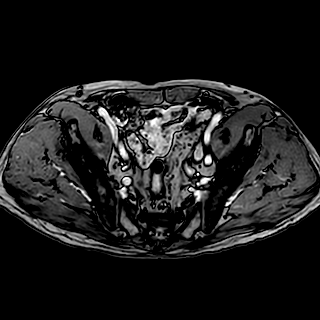
[im 36/72]
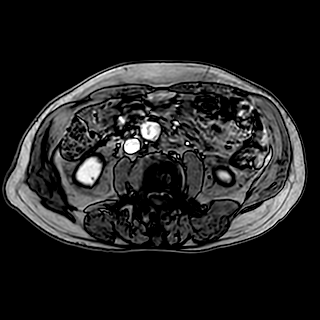
[im 54/72]
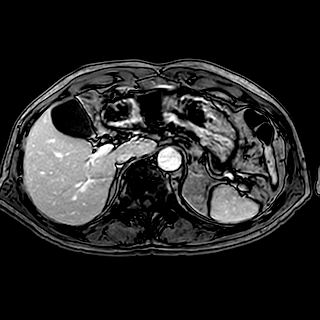
[im 72/72]
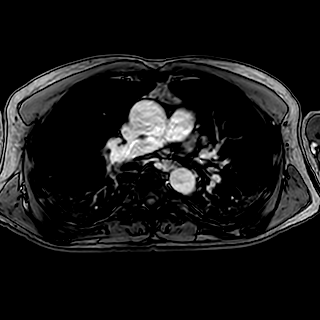

[Series 903: sin phase · axial · 6.0mm · 1.09mm/px · z∈[-267,+218]mm · 5 of 72 slices shown (2 of 2)]
[im 1/72]
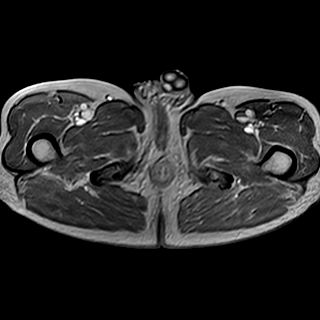
[im 18/72]
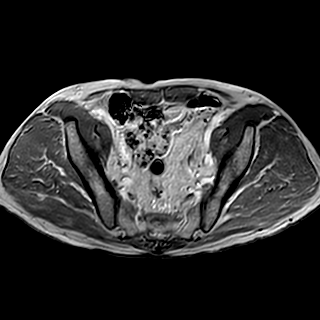
[im 36/72]
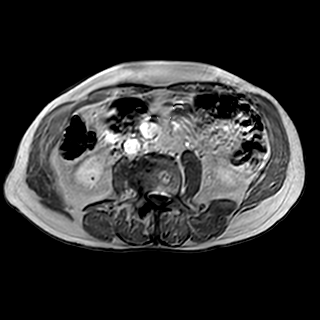
[im 54/72]
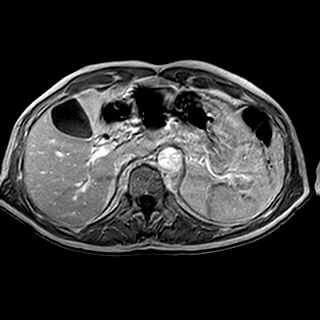
[im 72/72]
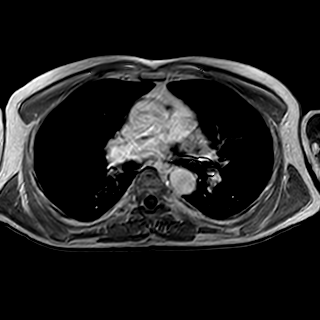

[Series 1001: T1 dynamic · coronal · 5.0mm · 0.98mm/px · 1 of 72 slices shown]
[im 1/72]
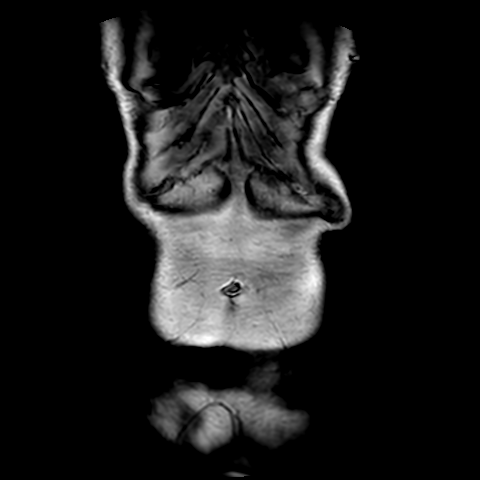

[39 of 48 positions shown; findings below may reference images not displayed]

FINDINGS: There is a 6 mm area of intraluminal projection with restricted diffusion within 
the left side of the bladder near the level of the left UVJ. This was seen is a 
hyperdense area on the prior CT exam. Postcontrast series obscure this area 
given ureteral jets and bladder attention with contrast.  
No hydronephrosis. There are bilateral renal cysts. The liver and spleen are 
normal in size and attenuation. No signal dropout of the liver to suggest 
hepatic steatosis. Accessory spleen. No abnormal enhancement. The pancreas, and 
adrenal glands are negative. Bowel is normal in course and caliber. Scattered 
atheromatous changes with mild aortic ectasia. No lymphadenopathy. Lumbar 
levocurvature and scattered degenerative changes.
IMPRESSION: Intraluminal filling projection within the left side of the bladder near the 
level of the left UVJ. Transitional cell carcinoma should be excluded. Recommend 
cystoscopy.

## 2022-06-28 IMAGING — MR MRI ABDOMEN W/WO CONTRAST
11 of 12 series · 39 of 48 positions shown · IV contrast (gadolinium)
Comparison: CTA exam of 02/10/2022

________________________________________________________________________________________________ 
MRI PELVIS W/WO CONTRAST, MRI ABDOMEN W/WO CONTRAST, 06/28/2022 [DATE]: 
CLINICAL INDICATION: Follow-up to CTA exam. Lower abdominal pain and a small 
amount of hematuria x3 weeks. Other specified disorders of bladder.
TECHNIQUE: Multiplanar, multiecho position MR images of the abdomen and pelvis 
were performed without and with 7 mL of intravenous gadolinium were injected by 
hand. 0.5 mL of gadolinium were discarded.

[Series 101: survey supine · axial · 15.0mm · 1.76mm/px · 1 of 17 slices shown]
[im 1/17]
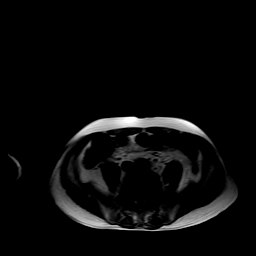

[Series 201: (id) cor bh · coronal · 4.0mm · 0.75mm/px · 1 of 36 slices shown]
[im 1/36]
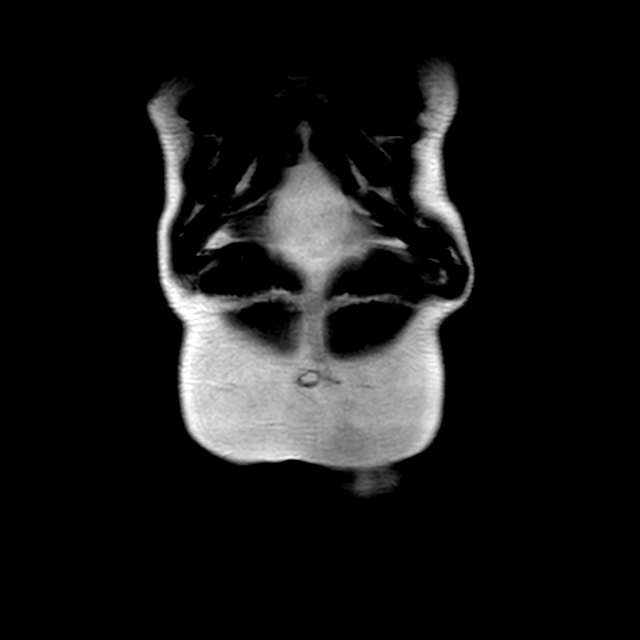

[Series 302: sout of phase · axial · 6.0mm · 1.09mm/px · z∈[-267,+218]mm · 5 of 72 slices shown (1 of 2)]
[im 1/72]
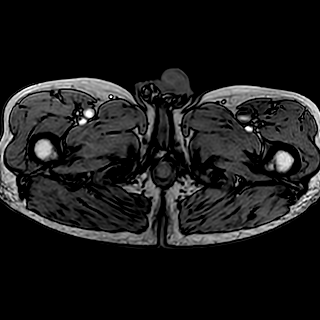
[im 18/72]
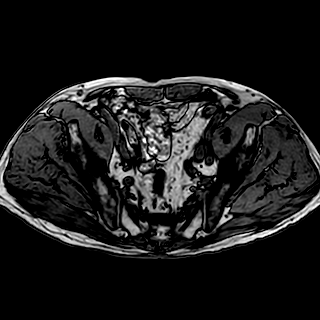
[im 36/72]
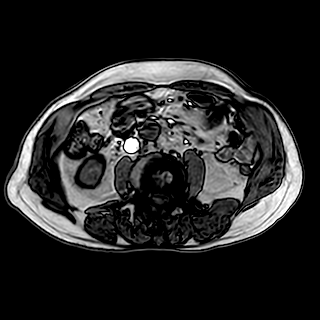
[im 54/72]
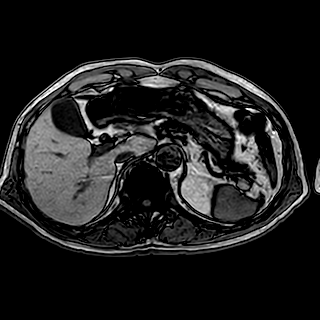
[im 72/72]
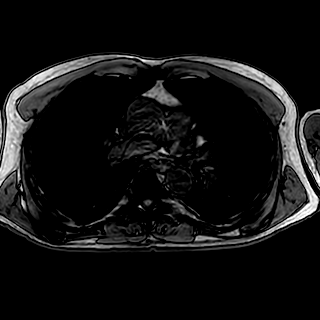

[Series 303: sin phase · axial · 6.0mm · 1.09mm/px · z∈[-267,+218]mm · 5 of 72 slices shown (1 of 2)]
[im 1/72]
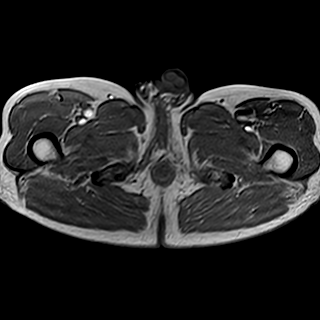
[im 18/72]
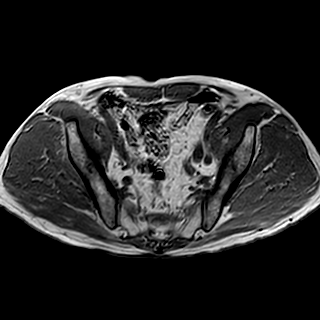
[im 36/72]
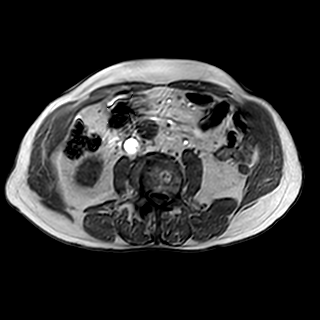
[im 54/72]
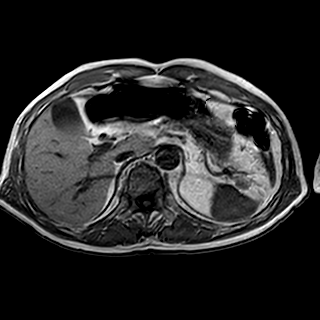
[im 72/72]
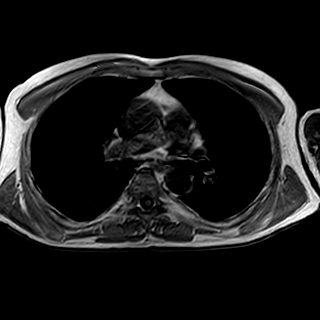

[Series 401: t2_ax_mvxd_hr_rt · axial · 5.0mm · 0.78mm/px · z∈[-261,+213]mm · 5 of 82 slices shown]
[im 1/82]
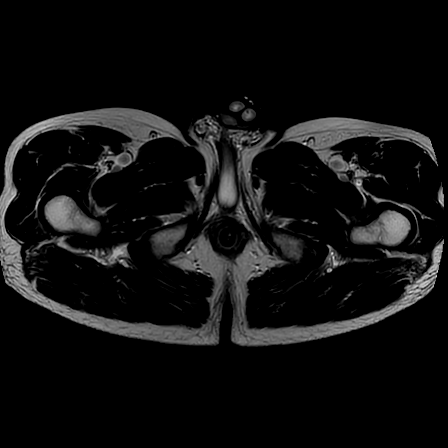
[im 21/82]
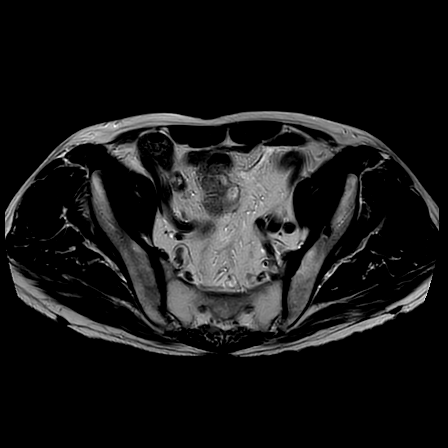
[im 41/82]
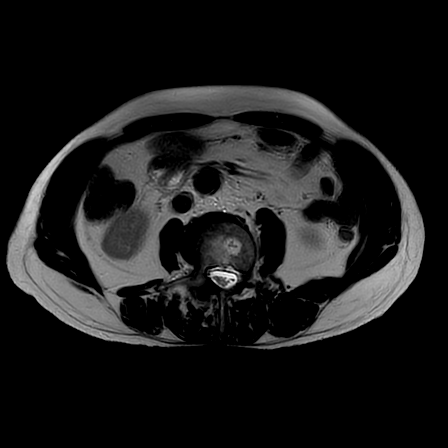
[im 61/82]
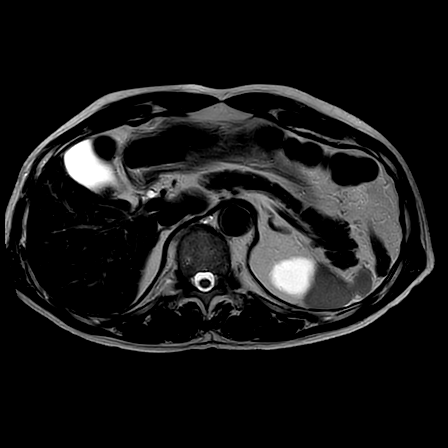
[im 82/82]
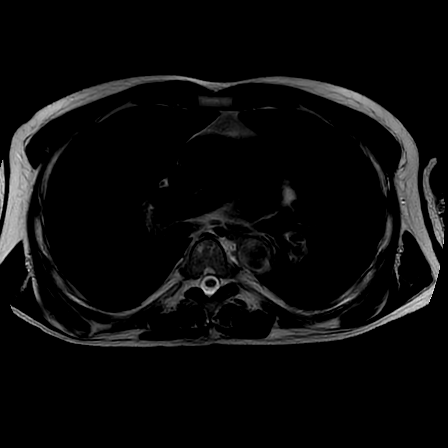

[Series 501: t2_spir_ax_mvxd_rt_fast · axial · 5.0mm · 0.88mm/px · z∈[-261,+201]mm · 5 of 80 slices shown]
[im 1/80]
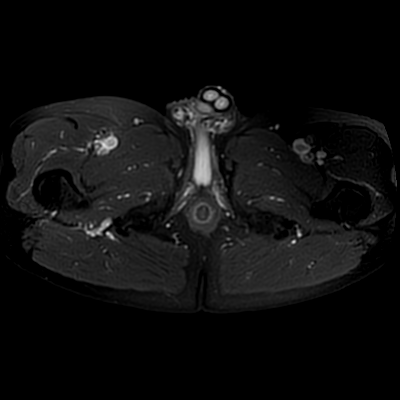
[im 20/80]
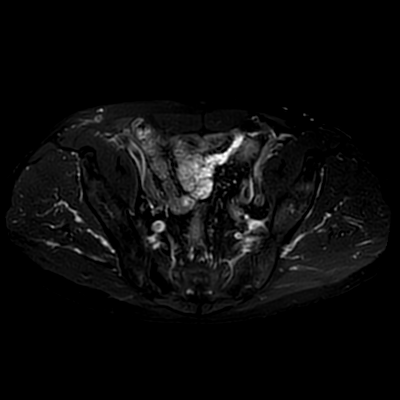
[im 40/80]
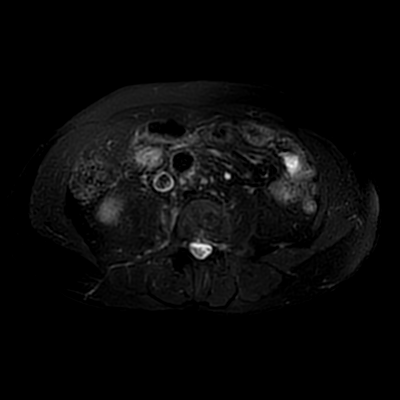
[im 60/80]
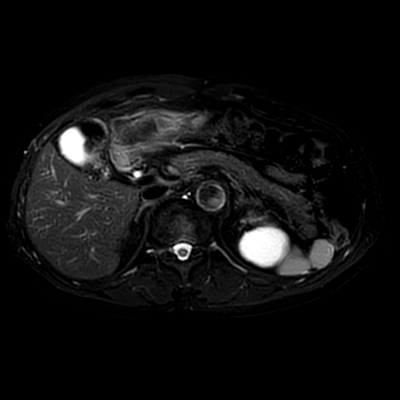
[im 80/80]
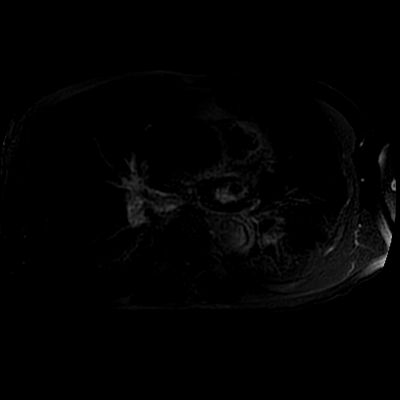

[Series 603: (id) · axial · 5.0mm · 1.52mm/px · z∈[-56,+181]mm · 3 of 44 slices shown]
[im 1/44]
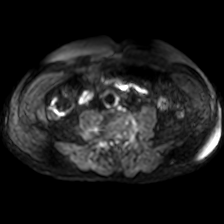
[im 22/44]
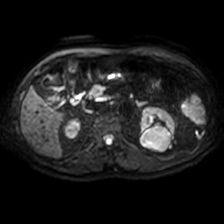
[im 44/44]
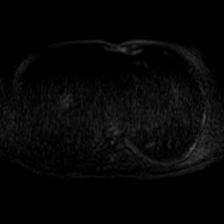

[Series 604: dadc 600 · axial · 5.0mm · 1.52mm/px · z∈[-56,+181]mm · 3 of 44 slices shown]
[im 1/44]
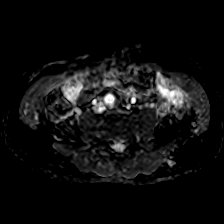
[im 22/44]
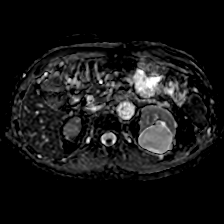
[im 44/44]
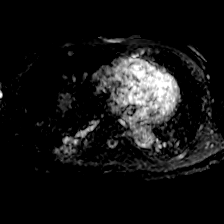

[Series 902: sout of phase · axial · 6.0mm · 1.09mm/px · z∈[-267,+218]mm · 5 of 72 slices shown (2 of 2)]
[im 1/72]
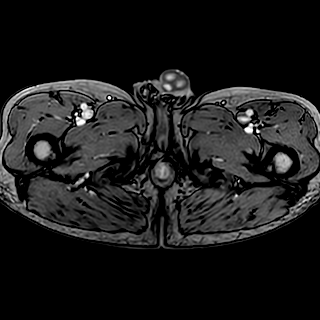
[im 18/72]
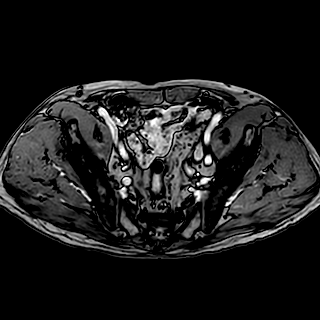
[im 36/72]
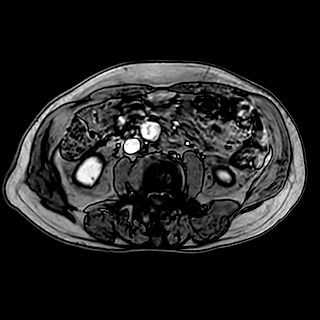
[im 54/72]
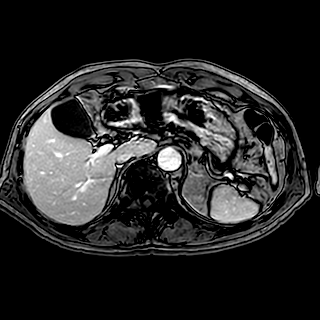
[im 72/72]
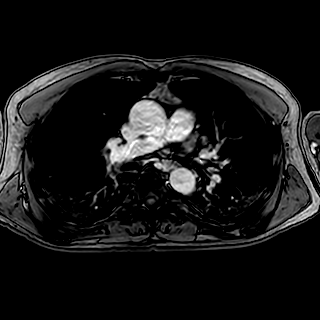

[Series 903: sin phase · axial · 6.0mm · 1.09mm/px · z∈[-267,+218]mm · 5 of 72 slices shown (2 of 2)]
[im 1/72]
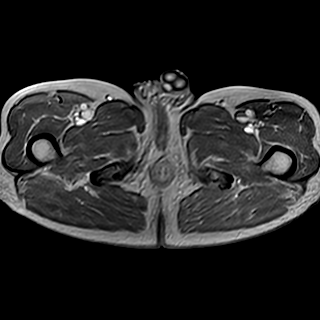
[im 18/72]
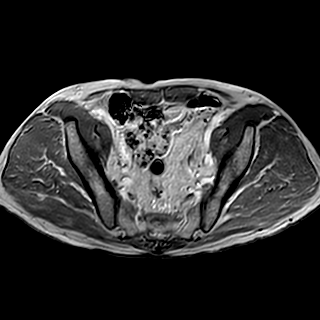
[im 36/72]
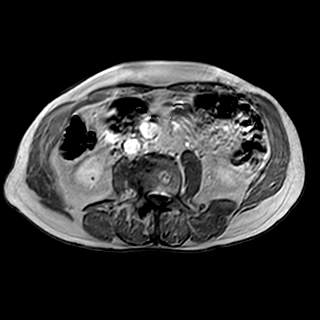
[im 54/72]
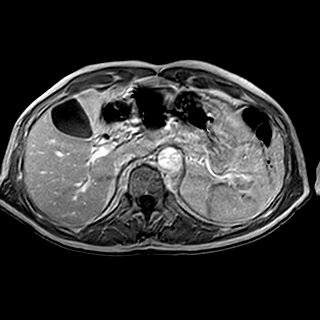
[im 72/72]
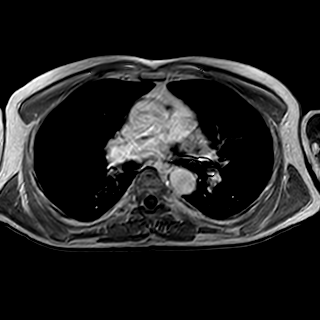

[Series 1001: T1 dynamic · coronal · 5.0mm · 0.98mm/px · 1 of 72 slices shown]
[im 1/72]
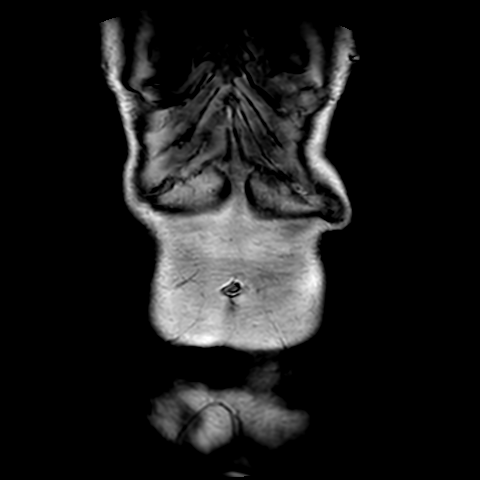

[39 of 48 positions shown; findings below may reference images not displayed]

FINDINGS: There is a 6 mm area of intraluminal projection with restricted diffusion within 
the left side of the bladder near the level of the left UVJ. This was seen is a 
hyperdense area on the prior CT exam. Postcontrast series obscure this area 
given ureteral jets and bladder attention with contrast.  
No hydronephrosis. There are bilateral renal cysts. The liver and spleen are 
normal in size and attenuation. No signal dropout of the liver to suggest 
hepatic steatosis. Accessory spleen. No abnormal enhancement. The pancreas, and 
adrenal glands are negative. Bowel is normal in course and caliber. Scattered 
atheromatous changes with mild aortic ectasia. No lymphadenopathy. Lumbar 
levocurvature and scattered degenerative changes.
IMPRESSION: Intraluminal filling projection within the left side of the bladder near the 
level of the left UVJ. Transitional cell carcinoma should be excluded. Recommend 
cystoscopy.

## 6567-01-28 DEATH — deceased
